# Patient Record
Sex: Male | Born: 1935 | Race: White | Hispanic: No | Marital: Married | Smoking: Former smoker
Health system: Southern US, Community
[De-identification: ages and names within clinical notes are randomized; demographics above are authoritative.]

## PROBLEM LIST (undated history)

## (undated) DIAGNOSIS — C679 Malignant neoplasm of bladder, unspecified: Secondary | ICD-10-CM

## (undated) DIAGNOSIS — Q453 Other congenital malformations of pancreas and pancreatic duct: Secondary | ICD-10-CM

## (undated) DIAGNOSIS — I10 Essential (primary) hypertension: Secondary | ICD-10-CM

## (undated) DIAGNOSIS — E039 Hypothyroidism, unspecified: Secondary | ICD-10-CM

## (undated) DIAGNOSIS — K5190 Ulcerative colitis, unspecified, without complications: Secondary | ICD-10-CM

## (undated) DIAGNOSIS — K859 Acute pancreatitis without necrosis or infection, unspecified: Secondary | ICD-10-CM

## (undated) DIAGNOSIS — I4891 Unspecified atrial fibrillation: Secondary | ICD-10-CM

## (undated) DIAGNOSIS — N183 Chronic kidney disease, stage 3 unspecified: Secondary | ICD-10-CM

## (undated) DIAGNOSIS — M1990 Unspecified osteoarthritis, unspecified site: Secondary | ICD-10-CM

## (undated) HISTORY — PX: CARDIAC CATHETERIZATION: SHX172

## (undated) HISTORY — DX: Chronic kidney disease, stage 3 unspecified: N18.30

## (undated) HISTORY — PX: COLONOSCOPY: SHX174

## (undated) HISTORY — PX: REPLACEMENT TOTAL KNEE: SUR1224

## (undated) HISTORY — DX: Malignant neoplasm of bladder, unspecified: C67.9

## (undated) HISTORY — DX: Acute pancreatitis without necrosis or infection, unspecified: K85.90

## (undated) HISTORY — DX: Chronic kidney disease, stage 3 (moderate): N18.3

## (undated) HISTORY — DX: Unspecified osteoarthritis, unspecified site: M19.90

## (undated) HISTORY — DX: Unspecified atrial fibrillation: I48.91

## (undated) HISTORY — PX: US ECHOCARDIOGRAPHY: HXRAD669

## (undated) HISTORY — PX: ESOPHAGOGASTRODUODENOSCOPY: SHX1529

## (undated) HISTORY — DX: Ulcerative colitis, unspecified, without complications: K51.90

## (undated) HISTORY — DX: Other congenital malformations of pancreas and pancreatic duct: Q45.3

## (undated) HISTORY — PX: CARDIOVASCULAR STRESS TEST: SHX262

---

## 2002-11-10 DIAGNOSIS — C679 Malignant neoplasm of bladder, unspecified: Secondary | ICD-10-CM

## 2002-11-10 HISTORY — DX: Malignant neoplasm of bladder, unspecified: C67.9

## 2003-05-11 HISTORY — PX: BACK SURGERY: SHX140

## 2009-02-08 HISTORY — PX: BLADDER SURGERY: SHX569

## 2011-05-11 HISTORY — PX: COLONOSCOPY: SHX174

## 2011-09-11 HISTORY — PX: EUS: SHX5427

## 2011-11-11 HISTORY — PX: FLEXIBLE SIGMOIDOSCOPY: SHX1649

## 2012-02-09 HISTORY — PX: FLEXIBLE SIGMOIDOSCOPY: SHX1649

## 2014-06-10 HISTORY — PX: BACK SURGERY: SHX140

## 2014-10-25 ENCOUNTER — Ambulatory Visit (INDEPENDENT_AMBULATORY_CARE_PROVIDER_SITE_OTHER): Payer: Medicare Other | Admitting: Gastroenterology

## 2014-10-25 ENCOUNTER — Encounter: Payer: Self-pay | Admitting: Gastroenterology

## 2014-10-25 VITALS — BP 150/87 | HR 71 | Temp 98.4°F | Ht 72.0 in | Wt 215.6 lb

## 2014-10-25 DIAGNOSIS — K5190 Ulcerative colitis, unspecified, without complications: Secondary | ICD-10-CM | POA: Insufficient documentation

## 2014-10-25 DIAGNOSIS — K861 Other chronic pancreatitis: Secondary | ICD-10-CM

## 2014-10-25 MED ORDER — PREDNISONE 1 MG PO TABS: ORAL_TABLET | ORAL | Status: DC

## 2014-10-25 NOTE — Progress Notes (Signed)
Primary Care Physician:  Andres Shad, MD  Primary Gastroenterologist:  Barney Drain, MD   Chief Complaint  Patient presents with  . Pancreatitis    HPI:  Louis Bowers is a 79 y.o. male here to establish care. He has h/o Ulcerative colitis diagnosed in 11/2011 and possible chronic pancreatitis, chronically abnormal alkphos, ALT.  Doing well. BM 1-2 solid/soft. No melena, brbpr. Prednisone for 3-4 years, on 23m daily chornically. No abdominal pain for one year. One episode of pancreatitis since EUS. Patient not real clear on details of his work up. Records available as outlined below. Last colonoscopy 2012. Heartburn well-controlled.   Current Outpatient Prescriptions  Medication Sig Dispense Refill  . CREON 24000 UNITS CPEP Take 72,000 Units by mouth 3 (three) times daily.     . DELZICOL 400 MG CPDR DR capsule Take 1,200 mg by mouth 3 (three) times daily.     . diphenhydrAMINE (BENADRYL) 25 mg capsule Take 25 mg by mouth every 6 (six) hours as needed.    .Marland KitchenELIQUIS 5 MG TABS tablet Take 5 mg by mouth 2 (two) times daily.     . hydrochlorothiazide (HYDRODIURIL) 25 MG tablet Take 25 mg by mouth daily.     .Marland Kitchenlevothyroxine (SYNTHROID, LEVOTHROID) 50 MCG tablet Take 50 mcg by mouth daily before breakfast.     . metoprolol (LOPRESSOR) 50 MG tablet Take 50 mg by mouth 3 (three) times daily.     .Marland KitchenPACERONE 100 MG tablet Take 100 mg by mouth daily.     . pantoprazole (PROTONIX) 40 MG tablet Take 40 mg by mouth daily.     . predniSONE (DELTASONE) 5 MG tablet Take 5 mg by mouth daily with breakfast.     . valsartan (DIOVAN) 320 MG tablet Take 320 mg by mouth daily.      No current facility-administered medications for this visit.    Allergies as of 10/25/2014  . (No Known Allergies)    Past Medical History  Diagnosis Date  . A-fib     Dr. ZRosalita Chessman . Pancreatitis     Dr.  SOliva Bustard Shifflett  . UC (ulcerative colitis)     Dr. SOliva Bustard Shifflett: diagnosed 11/2011 involved  entire colon  . Osteoarthritis   . Bladder cancer 2004    instilled liquid    Past Surgical History  Procedure Laterality Date  . [place]   Dr. DNathaneil CanaryShiflett: 5 polyps removed from cecal area all were benign  . Esophagogastroduodenoscopy  2005 [place]    Dr. DNathaneil CanaryShiflett: normal except for antral gastritis  . Colonoscopy  05/2011    Dr. DNathaneil CanaryShiflett:no polyps, diverticulosis  . Back surgery  05/2003  . Bladder surgery  02/2009  . Back surgery  06/2014  . Flexible sigmoidoscopy  02/2012    Dr. DNathaneil CanaryShiflett: advance up to 60cm, entire left colon with intense UC flare, no bx as patient was on coumdin  . Flexible sigmoidoscopy  11/2011    Dr. DNathaneil CanaryShiflett: diffuse colitis universal, cecal polyp removed (adenoma)  . Eus  09/2011    [facility]: chronic pancreatitis vs chronic smodering pancreatitis    Family History  Problem Relation Age of Onset  . Colon cancer Neg Hx   . Ulcerative colitis Neg Hx   . Liver disease Other     uncles: etoh    History   Social History  . Marital Status: Married    Spouse Name: N/A    Number of Children: 2  .  Years of Education: N/A   Occupational History  . 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 Smoking status: Never Smoker   . Smokeless tobacco: Not on file  . Alcohol Use: No  . Drug Use: No  . Sexual Activity: Not on file   Other Topics Concern  . Not on file   Social History Narrative      ROS:  General: Negative for anorexia, weight loss, fever, chills, fatigue, weakness. Eyes: Negative for vision changes.  ENT: Negative for hoarseness, difficulty swallowing , nasal congestion. CV: Negative for chest pain, angina, palpitations, dyspnea on exertion, peripheral edema.  Respiratory: Negative for dyspnea at rest, dyspnea on exertion, cough, sputum, wheezing.  GI: See history of present illness. GU:  Negative for dysuria, hematuria, urinary incontinence, urinary frequency,  nocturnal urination.  MS: Negative for joint pain, low back pain.  Derm: Negative for rash or itching.  Neuro: Negative for weakness, abnormal sensation, seizure, frequent headaches, memory loss, confusion.  Psych: Negative for anxiety, depression, suicidal ideation, hallucinations.  Endo: Negative for unusual weight change.  Heme: Negative for bruising or bleeding. Allergy: Negative for rash or hives.    Physical Examination:  BP 150/87 mmHg  Pulse 71  Temp(Src) 98.4 F (36.9 C) (Oral)  Ht 6' (1.829 m)  Wt 215 lb 9.6 oz (97.796 kg)  BMI 29.23 kg/m2   General: Well-nourished, well-developed in no acute distress.  Head: Normocephalic, atraumatic.   Eyes: Conjunctiva pink, no icterus. Mouth: Oropharyngeal mucosa moist and pink , no lesions erythema or exudate. Neck: Supple without thyromegaly, masses, or lymphadenopathy.  Lungs: Clear to auscultation bilaterally.  Heart: Regular rate and rhythm, no murmurs rubs or gallops.  Abdomen: Bowel sounds are normal, nontender, nondistended, no hepatosplenomegaly or masses, no abdominal bruits or    hernia , no rebound or guarding.   Rectal: not performed Extremities: No lower extremity edema. No clubbing or deformities.  Neuro: Alert and oriented x 4 , grossly normal neurologically.  Skin: Warm and dry, no rash or jaundice.   Psych: Alert and cooperative, normal mood and affect.  Labs: 02/2012 Tbili 0.6, AP 146H, AST 22, ALT 42H, alb 2.6L, Creatinine 1.24H, Hgb 13.5.  Imaging Studies: No results found.

## 2014-10-25 NOTE — Patient Instructions (Signed)
1. We will begin prednisone taper. Start at 51m daily for 2 weeks, then 367mdaily for 2 weeks, then 46m104maily for 2 weeks, then 1mg50mily for 2 weeks, then off. Call if you notice abdominal pain, increased number of stools, looser stools, blood in stools. 2. Stop prednisone 5mg 10mlets. 3. I will review your most recent labs. 4. Please check your records to see if you have had a bone density test to look for osteopenia or osteoporosis.  5. Return to the office in 3 months to meet Dr. FieldOneida Alarill discuss your case with her in the interim and if she decides you need any additional work up at this time we will let you know.

## 2014-10-28 ENCOUNTER — Encounter: Payer: Self-pay | Admitting: Gastroenterology

## 2014-10-28 NOTE — Assessment & Plan Note (Signed)
79 y/o male with h/o UC diagnosed in 2013. He reports being on chronic prednisone therapy. Doing well for one year. Last complete TCS 2012. H/o adenomatous polyps. Discussed attempting slow prednisone taper. Notably he is on high dose Delzicol and has elevated creatinine in 2013. Requested most recent labs. Will need to check creatinine and LFTs if not done. Return to office in 3 months to see Dr. Oneida Alar. Call with problems in the interim. He will also check his records to see if previous bone density done and if not we will get baseline.

## 2014-10-28 NOTE — Assessment & Plan Note (Signed)
H/o chronic pancreatitis. Previous EUS inconclusive for chronic pancreatitis vs smoldering pancreatitis. F/u MRCP was suggested but I don't have any records of this. Currently on pancreatic enzymes. To discuss with Dr. Oneida Alar.

## 2014-11-21 ENCOUNTER — Encounter: Payer: Self-pay | Admitting: Gastroenterology

## 2014-11-21 NOTE — Progress Notes (Signed)
cc'ed to pcp °

## 2014-12-06 NOTE — Progress Notes (Signed)
Reviewed labs from outside source May 2015 BUN 28, creatinine 1.39(upper limit of normal 1.35), hemoglobin 11.2 low, platelets 207,000, white blood cell count 8700, TSH 64.176, T3 76.49 low, T4 1 0.8 low Labs from January 2015, total bilirubin 0.3, alkaline phosphatase 74, AST 14, ALT 12, albumin 4.2. Hemoglobin A1c 6.4  Please let patient know that we received previous labs and reviewed.   He is due labs at this time including CMET, CBC, iron/tibc, ferritin, TSH.  How has he done with prednisone taper? Off prednisone yet?

## 2014-12-07 NOTE — Progress Notes (Addendum)
REVIEWED. Agree with slow steroid taper(decerease by 1 mg every 2 weeks) and updating labs. REVIEWED WITH BENNY. CAUTION IN RENAL INSUFFICIENCY BUT NO DEFINITE CUT OFF. FAVOR LOWEST THERAPEUTIC DOSE. GAINED 28 LBS SINCE 2013. CONTINUE PANCREATIC ENZYMES. CONTINUE TO MONITOR SYMPTOMS. REPEAT MRCP IF FOLLOW UP IMAGING RECOMMENDED BY OUTSIDE PROVIDER AND STUDY NOT COMPLETE.

## 2014-12-08 ENCOUNTER — Other Ambulatory Visit: Payer: Self-pay

## 2014-12-08 DIAGNOSIS — K861 Other chronic pancreatitis: Secondary | ICD-10-CM

## 2014-12-08 DIAGNOSIS — K5190 Ulcerative colitis, unspecified, without complications: Secondary | ICD-10-CM

## 2014-12-08 NOTE — Progress Notes (Signed)
Pt is aware of results. Lab orders faxed to [facility] and mailed to him per his request. He is aware he is due an [place] in March and will be contacted for that or he will call if he doesn't hear from office.

## 2014-12-12 ENCOUNTER — Telehealth: Payer: Self-pay

## 2014-12-12 NOTE — Telephone Encounter (Signed)
Manuela Schwartz from [facility] called and needed diagnosis for TSH. Nothing listed in chart to cover so she did not draw that since it would cost the pt $206.00.

## 2014-12-12 NOTE — Telephone Encounter (Signed)
Everardo All called Manuela Schwartz at [facility] [phone] and spoke to [place]. The TSH has been added with the ICD 10 code of E03.9 for Hypothyroidism.

## 2014-12-12 NOTE — Telephone Encounter (Signed)
TSH elevated at 64 on 03/2014. I wanted to recheck it.

## 2014-12-12 NOTE — Telephone Encounter (Signed)
Thanks

## 2014-12-26 ENCOUNTER — Encounter: Payer: Self-pay | Admitting: Gastroenterology

## 2014-12-26 NOTE — Progress Notes (Signed)
Patient ID: Louis Bowers, male   DOB: 1934-12-16, 80 y.o.   MRN: [id number]  Labs dated 12/12/2014 reviewed White blood cell count 7500, hemoglobin 10.9, hematocrit 34.6, MCV 89.4, platelets 204,000 on BUN 33, creatinine 1.4, total bilirubin 0.3, alkaline phosphatase 82, AST 15, ALT 15, albumin 3.7 Iron 31 low, TIBC 498 high, iron saturation 6% low TSH 6.61 high   1. Find out what dose prednisone he is on currently. How is his UC? 2. He needs to follow up with provider who is managing his thyroid, his TSH was 6.61 this month and may need to have synthroid adjusted. 3. We will need to work towards getting his Delzicol dose down due to renal insufficiency. If he tolerates prednisone taper we may try to drop Delzicol to 850m TID at his next appointment. 4. At his next appointment, we need to also discuss possibility of MRI abd/MRCP to follow up on pancreatic abnormalities seen on EUS in 2012 per Dr. FOneida Alar   Is [facility] schedule out for march, if so please get patient scheduled (reason for visit: UC, pancreatic abnormality)

## 2014-12-27 NOTE — Progress Notes (Signed)
I called pt. He said he is doing very well now. He is taking Delzicol 400 mg, three tablets tid.  He is taking Prednisone 5 mg daily now.  He will discuss his TSH with PCP.  He is aware he will be contacted about an appt in the very near future.

## 2014-12-27 NOTE — Progress Notes (Signed)
APPOINTMENT MADE AND PATIENT AWARE OF DATE AND TIME

## 2015-01-24 ENCOUNTER — Encounter: Payer: Self-pay | Admitting: Gastroenterology

## 2015-01-24 ENCOUNTER — Ambulatory Visit (INDEPENDENT_AMBULATORY_CARE_PROVIDER_SITE_OTHER): Payer: Medicare Other | Admitting: Gastroenterology

## 2015-01-24 VITALS — BP 138/76 | HR 57 | Temp 97.8°F | Ht 71.0 in | Wt 221.0 lb

## 2015-01-24 DIAGNOSIS — K5190 Ulcerative colitis, unspecified, without complications: Secondary | ICD-10-CM | POA: Diagnosis not present

## 2015-01-24 DIAGNOSIS — K861 Other chronic pancreatitis: Secondary | ICD-10-CM | POA: Diagnosis not present

## 2015-01-24 NOTE — Patient Instructions (Signed)
[place].   TRY TO TAPER PREDNISONE TO 3 MG DAILY.  TAPER YOUR PREDNISONE TO 2 MG BEFORE YOUR VISIT WITH DR. HALL SO HE CAN SEE THE RASH AND DETERMINE WHETHER YOU NEED THE PREDNISONE CHRONICALLY. STOP THE ELIQUIS 2 DAYS PRIOR TO YOUR VISIT WITH DR. HALL, HE MAY CONSIDER A SKIN BIOPSY.  FOLLOW UP IN 4 MOS.

## 2015-01-24 NOTE — Assessment & Plan Note (Signed)
SYMPTOMS CONTROLLED/RESOLVED. GOT A RHHS WHEN PREDNISONE TAPERED.  CONTINUE DELZICOL. IT MAY CAUSE A RASH. TRY TO TAPER PREDNISONE TO 3 MG DAILY. SEE DR. HALL FOR SKIN EVAL. HE MAY CONSIDER A SKIN BIOPSY. FOLLOW UP IN 4 MOS.

## 2015-01-24 NOTE — Progress Notes (Signed)
ON RECALL LIST  °

## 2015-01-24 NOTE — Assessment & Plan Note (Signed)
SYMPTOMS CONTROLLED/RESOLVED.  CONTINUE CREON. WATCH FAT INTAKE FOLLOW UP IN 4 MOS.

## 2015-01-24 NOTE — Progress Notes (Signed)
Subjective:    Patient ID: Louis Bowers, male    DOB: [birth year], 80 y.o.   MRN: [id number]  Louis Shad, MD  HPI No questions or concerns. HAD BLOOD DRAWN FEB 2016. BMs: 1-2 DAILY. EATS LOTS OF FIBER(APPLES, CABBAGE). HAD BACK OPERATION IN AUG 2015 AND KNEE REPLACEMENT DEC 2015. WEIGHT GAIN. APPETITE: GOOD. STAMINA COULD BE BETTER. BLOOD THINNER: ELIQUIS FOR 1-2 YRS. TRIED TO WEAN OFF STEROIDS(2 TO 3 MG) AND RASH ON ARMS, STOMACH, BODY, AND BACK. ON 1 MG UNCOMFORTABLE(ITCHING-DR. HALL SKIN MD-NEXT OPV APR 2016).   PT DENIES FEVER, CHILLS, HEMATOCHEZIA, nausea, vomiting, melena, diarrhea, CHEST PAIN, SHORTNESS OF BREATH,  constipation, abdominal pain, problems swallowing, problems with sedation, OR heartburn or indigestion.  Past Medical History  Diagnosis Date  . A-fib     Dr. Rosalita Chessman  . Pancreatitis     Dr.  Oliva Bustard. Shifflett  . UC (ulcerative colitis)     Dr. Oliva Bustard. Shifflett: diagnosed 11/2011 involved entire colon  . Osteoarthritis   . Bladder cancer 2004    instilled chemo   Past Surgical History  Procedure Laterality Date  . Colonoscopy N/A 2005 [place]    Dr. Nathaneil Canary Shiflett: 5 polyps removed from cecal area all were benign  . Esophagogastroduodenoscopy  2005 [place]    Dr. Nathaneil Canary Shiflett: normal except for antral gastritis  . Colonoscopy  05/2011    Dr. Nathaneil Canary Shiflett:no polyps, diverticulosis  . Back surgery  05/2003  . Bladder surgery  02/2009  . Back surgery  06/2014  . Flexible sigmoidoscopy  02/2012    Dr. Nathaneil Canary Shiflett: advance up to 60cm, entire left colon with intense UC flare, no bx as patient was on coumdin  . Flexible sigmoidoscopy  11/2011    Dr. Nathaneil Canary Shiflett: diffuse colitis universal, cecal polyp removed (adenoma)  . Eus  09/2011    [facility]: chronic pancreatitis vs chronic smodering pancreatitis   No Known Allergies   Current Outpatient Prescriptions  Medication Sig Dispense Refill  . CREON 24000 UNITS CPEP Take 72,000  Units by mouth 3 (three) times daily.     . DELZICOL 400 MG CPDR DR capsule Take 1,200 mg by mouth 3 (three) times daily.  SINCE 2013   . ELIQUIS 5 MG TABS tablet Take 5 mg by mouth 2 (two) times daily.     . hydrochlorothiazide (HYDRODIURIL) 25 MG tablet Take 25 mg by mouth daily.     Marland Kitchen levothyroxine (SYNTHROID, LEVOTHROID) 50 MCG tablet Take 50 mcg by mouth daily before breakfast.     . metoprolol (LOPRESSOR) 50 MG tablet Take 50 mg by mouth 3 (three) times daily.     Marland Kitchen PACERONE 100 MG tablet Take 100 mg by mouth daily.     . pantoprazole (PROTONIX) 40 MG tablet Take 40 mg by mouth daily.     . valsartan (DIOVAN) 320 MG tablet Take 320 mg by mouth daily.     . diphenhydrAMINE (BENADRYL) 25 mg capsule Take 25 mg by mouth every 6 (six) hours as needed. FOR SLEEP   . PREDNISONE 5 MG DAILY FOR RASH    Family History  Problem Relation Age of Onset  . Colon cancer Neg Hx   . Ulcerative colitis Neg Hx   . Liver disease Other     uncles: etoh   History   Social History  . Marital Status: Married    Spouse Name: N/A  . Number of Children: 2  . Years of Education: N/A   Occupational History  .  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 Smoking status: Never Smoker   . Smokeless tobacco: Not on file  . Alcohol Use: No  . Drug Use: No  . Sexual Activity: Not on file   Other Topics Concern  . Not on file   Social History Narrative    Review of Systems PER HPI OTHERWISE ALL SYSTEMS ARE NEGATIVE.    Objective:   Physical Exam  Constitutional: He is oriented to person, place, and time. He appears well-developed and well-nourished. No distress.  HENT:  Head: Normocephalic and atraumatic.  Mouth/Throat: Oropharynx is clear and moist. No oropharyngeal exudate.  Eyes: Pupils are equal, round, and reactive to light. No scleral icterus.  Neck: Normal range of motion. Neck supple.  Cardiovascular: Normal rate, regular rhythm and normal heart sounds.     Pulmonary/Chest: Effort normal and breath sounds normal. No respiratory distress.  Abdominal: Soft. Bowel sounds are normal. He exhibits no distension. There is no tenderness.  Musculoskeletal: He exhibits edema (TRACE BIL LE).  Lymphadenopathy:    He has no cervical adenopathy.  Neurological: He is alert and oriented to person, place, and time.  NO FOCAL DEFICITS   Psychiatric: He has a normal mood and affect.  Vitals reviewed.         Assessment & Plan:

## 2015-01-30 NOTE — Progress Notes (Signed)
CC'ED TO PCP 

## 2015-02-12 ENCOUNTER — Other Ambulatory Visit: Payer: Self-pay

## 2015-02-14 NOTE — Telephone Encounter (Signed)
Please find out what dose prednisone he is on. Last note said was tapering to 58m daily. Need to know so I can give 118mtablets instead of 31m60mf needed.

## 2015-02-15 NOTE — Telephone Encounter (Signed)
I called Louis Bowers. He said that Dr. Oneida Alar wanted him to decrease to 2 mg. He goes back to see Dr. [place] Crane on 03/19/2015.  He would like to get 200 tablets. He said if the 2 mg works he will probably stay on that.

## 2015-02-16 MED ORDER — PREDNISONE 1 MG PO TABS: 2.0000 mg | ORAL_TABLET | Freq: Every day | ORAL | Status: DC

## 2015-02-16 NOTE — Telephone Encounter (Signed)
Done

## 2015-03-06 ENCOUNTER — Other Ambulatory Visit: Payer: Self-pay

## 2015-03-07 MED ORDER — MESALAMINE 400 MG PO CPDR: 1200.0000 mg | DELAYED_RELEASE_CAPSULE | Freq: Three times a day (TID) | ORAL | Status: DC

## 2015-03-14 ENCOUNTER — Other Ambulatory Visit: Payer: Self-pay

## 2015-03-16 MED ORDER — PANCRELIPASE (LIP-PROT-AMYL) 24000-76000 UNITS PO CPEP: 72000.0000 [IU] | ORAL_CAPSULE | Freq: Three times a day (TID) | ORAL | Status: DC

## 2015-03-19 ENCOUNTER — Telehealth: Payer: Self-pay

## 2015-03-19 NOTE — Telephone Encounter (Signed)
Pt came by the office to see about prescription refills. He asked about creon. Informed him that it was sent to the pharmacy on Friday 03/16/15.   He also asked if we could change rx for delzicol to #90 day supply instead of #30 day supply.   Routing to [place] and the refill box.

## 2015-03-20 NOTE — Telephone Encounter (Signed)
Can we please verify how he is taking Delzicol?

## 2015-03-20 NOTE — Telephone Encounter (Signed)
Pt called and states that the Delzicol he takes is 3 pills TID.  Also states the Creon is  the same dosage (3 pills TID)

## 2015-03-20 NOTE — Telephone Encounter (Signed)
LMOM for a return call.  

## 2015-03-22 ENCOUNTER — Other Ambulatory Visit: Payer: Self-pay

## 2015-03-23 MED ORDER — MESALAMINE 400 MG PO CPDR
1200.0000 mg | DELAYED_RELEASE_CAPSULE | Freq: Three times a day (TID) | ORAL | Status: DC
Start: 2015-03-23 — End: 2015-07-19

## 2015-03-23 NOTE — Telephone Encounter (Signed)
Spoke with wife Tessie Fass) states she will tell pt.

## 2015-03-23 NOTE — Telephone Encounter (Signed)
Please let patient know that I prescribed only one 90 day supply because we may be changing his dose once he is able to taper off his prednisone.   Purpose of trying to taper down his dose is due to his mildly elevated creatinine.   I sent RX to [facility] for 90 day. I hope that was correct because I didn't see any other pharmacies listed.

## 2015-04-25 ENCOUNTER — Encounter: Payer: Self-pay | Admitting: Gastroenterology

## 2015-05-11 ENCOUNTER — Other Ambulatory Visit: Payer: Self-pay

## 2015-05-15 MED ORDER — PREDNISONE 1 MG PO TABS: 2.0000 mg | ORAL_TABLET | Freq: Every day | ORAL | Status: DC

## 2015-05-28 ENCOUNTER — Encounter: Payer: Self-pay | Admitting: Gastroenterology

## 2015-05-28 ENCOUNTER — Ambulatory Visit (INDEPENDENT_AMBULATORY_CARE_PROVIDER_SITE_OTHER): Payer: Medicare Other | Admitting: Gastroenterology

## 2015-05-28 VITALS — BP 119/72 | HR 64 | Temp 97.9°F | Ht 72.0 in | Wt 219.4 lb

## 2015-05-28 DIAGNOSIS — K861 Other chronic pancreatitis: Secondary | ICD-10-CM | POA: Diagnosis not present

## 2015-05-28 DIAGNOSIS — K5190 Ulcerative colitis, unspecified, without complications: Secondary | ICD-10-CM | POA: Diagnosis not present

## 2015-05-28 NOTE — Progress Notes (Signed)
Primary Care Physician: Andres Shad, MD  Primary Gastroenterologist:  Barney Drain, MD   Chief Complaint  Patient presents with  . Follow-up    HPI: Louis Bowers is a 80 y.o. male here for follow-up of ulcerative colitis and chronic pancreatitis. Last seen in March 2016 by Dr. Oneida Alar. Overall patient feels well. Little bit of constipation since starting bladder medications. Denies any blood in the stool. No abdominal pain. 24 hour episode of nausea, vomiting, diarrhea couple weeks ago. Appetite is good. Currently on 2 mg of prednisone daily. Has not seen any rash on his arms or legs like before. Continues Delzicol 3 tablets 3 times daily.  Next TCS due July 2017  Wt Readings from Last 3 Encounters:  05/28/15 219 lb 6.4 oz (99.519 kg)  01/24/15 221 lb (100.245 kg)  10/25/14 215 lb 9.6 oz (97.796 kg)     Current Outpatient Prescriptions  Medication Sig Dispense Refill  . diphenhydrAMINE (BENADRYL) 25 mg capsule Take 25 mg by mouth every 6 (six) hours as needed.    Marland Kitchen ELIQUIS 5 MG TABS tablet Take 5 mg by mouth 2 (two) times daily.     . hydrochlorothiazide (HYDRODIURIL) 25 MG tablet Take 25 mg by mouth daily.     Marland Kitchen levothyroxine (SYNTHROID, LEVOTHROID) 50 MCG tablet Take 50 mcg by mouth daily before breakfast.     . Mesalamine (DELZICOL) 400 MG CPDR DR capsule Take 3 capsules (1,200 mg total) by mouth 3 (three) times daily. 810 capsule 0  . metoprolol (LOPRESSOR) 50 MG tablet Take 50 mg by mouth 3 (three) times daily.     Marland Kitchen PACERONE 100 MG tablet Take 100 mg by mouth daily.     . Pancrelipase, Lip-Prot-Amyl, (CREON) 24000 UNITS CPEP Take 3 capsules (72,000 Units total) by mouth 3 (three) times daily. 270 capsule 5  . pantoprazole (PROTONIX) 40 MG tablet Take 40 mg by mouth daily.     . predniSONE (DELTASONE) 1 MG tablet Take 2 tablets (2 mg total) by mouth daily with breakfast. 200 tablet 0  . solifenacin (VESICARE) 10 MG tablet Take by mouth daily.    . valsartan  (DIOVAN) 320 MG tablet Take 320 mg by mouth daily.      No current facility-administered medications for this visit.    Allergies as of 05/28/2015  . (No Known Allergies)    ROS:  General: Negative for anorexia, weight loss, fever, chills, fatigue, weakness. ENT: Negative for hoarseness, difficulty swallowing , nasal congestion. CV: Negative for chest pain, angina, palpitations, dyspnea on exertion, peripheral edema.  Respiratory: Negative for dyspnea at rest, dyspnea on exertion, cough, sputum, wheezing.  GI: See history of present illness. GU:  Negative for dysuria, hematuria, urinary incontinence, urinary frequency, nocturnal urination.  Endo: Negative for unusual weight change.    Physical Examination:   BP 119/72 mmHg  Pulse 64  Temp(Src) 97.9 F (36.6 C)  Ht 6' (1.829 m)  Wt 219 lb 6.4 oz (99.519 kg)  BMI 29.75 kg/m2  General: Well-nourished, well-developed in no acute distress.  Eyes: No icterus. Mouth: Oropharyngeal mucosa moist and pink , no lesions erythema or exudate. Lungs: Clear to auscultation bilaterally.  Heart: Regular rate and rhythm, no murmurs rubs or gallops.  Abdomen: Bowel sounds are normal, nontender, nondistended, no hepatosplenomegaly or masses, no abdominal bruits or hernia , no rebound or guarding.   Extremities: No lower extremity edema. No clubbing or deformities. Neuro: Alert and oriented x 4   Skin: Warm  and dry, no jaundice.   Psych: Alert and cooperative, normal mood and affect.  Imaging Studies: No results found.  Last labs available from February 2016 White blood cell count 7500, hemoglobin 10.9, hematocrit 34.6, MCV 89.4, platelets 204,000 on BUN 33, creatinine 1.4, total bilirubin 0.3, alkaline phosphatase 82, AST 15, ALT 15, albumin 3.7 Iron 31 low, TIBC 498 high, iron saturation 6% low TSH 6.61 high

## 2015-05-28 NOTE — Assessment & Plan Note (Signed)
Symptoms controlled. Continue Creon. Return to the office in 4 months.

## 2015-05-28 NOTE — Assessment & Plan Note (Signed)
Continues to do well. Tapered to 2 mg of prednisone daily without recurrent rash. We'll try to continue taper with eventually stopping medication if tolerated. After he is off prednisone for couple weeks would like to see his Delzicol dose reduce to 2 tablets 3 times daily in light of his elevated creatinine.   Retrieve copy of latest labs.  Return to the office in 4 months or call sooner if needed.

## 2015-05-28 NOTE — Progress Notes (Signed)
cc'ed to pcp °

## 2015-05-28 NOTE — Patient Instructions (Signed)
1. I will request copy of labs for review. 2. Cut back on prednisone to 18m daily for 7 days. If you are doing well at that time then you can stop the prednisone.  3. After being off prednisone for 2 weeks, if you are doing well, then I want you to cut back on Delzicol to 2 pills three times daily.  4. Return to the office in four months for follow up.

## 2015-06-25 ENCOUNTER — Telehealth: Payer: Self-pay | Admitting: Gastroenterology

## 2015-06-25 MED ORDER — PREDNISONE 10 MG PO TABS: ORAL_TABLET | ORAL | Status: DC

## 2015-06-25 NOTE — Addendum Note (Signed)
Addended by: Danie Binder on: 06/25/2015 05:18 PM   Modules accepted: Orders

## 2015-06-25 NOTE — Telephone Encounter (Signed)
[phone]    PLEASE CALL, HE IS HAVING A FLAIR UP OF STOMACH PAIN.  PATIENT BELIEVES HE NEEDS TO BE ON PREDNISONE TO CONTROL IT.  HE HAS COME OFF OF IT AND [facility] TOLD HIM TO CALL OFFICE IF HE HAS A FLAIR UP

## 2015-06-25 NOTE — Telephone Encounter (Addendum)
BEEN OFF PREDNISONE FOR ABOUT A MONTH. NOT HAVING A NL BM. SML WATERY STOOL x1 TODAY. NAUSEATED FOR 2 WEEKS. NO BLOOD IN STOOL.  TAKING DELZICOL 3 PO TID. PRENISONE 20 MG QD FOR 2 WEEKS THEN 10 MG DAILY FOR 2 WEEKS. HAS NEVER TRIED IMURAN OR BIOLOGIC. ABX RECENTLY-FOR BLADDER(ONE DOSE LAST WEEK). PT ASKED TO CALL WITH QUESTIONS OR CONCERNS.

## 2015-06-25 NOTE — Telephone Encounter (Signed)
I called pt and he said he has been having more problems with his stomach for about 2 weeks and Dr. Oneida Alar told him to call if he feels he needs prednisone.

## 2015-07-19 ENCOUNTER — Encounter: Payer: Self-pay | Admitting: Gastroenterology

## 2015-07-19 ENCOUNTER — Telehealth: Payer: Self-pay | Admitting: Gastroenterology

## 2015-07-19 MED ORDER — PREDNISONE 5 MG PO TABS: ORAL_TABLET | ORAL | Status: DC

## 2015-07-19 MED ORDER — MESALAMINE 400 MG PO CPDR: 1200.0000 mg | DELAYED_RELEASE_CAPSULE | Freq: Three times a day (TID) | ORAL | Status: DC

## 2015-07-19 MED ORDER — PANCRELIPASE (LIP-PROT-AMYL) 24000-76000 UNITS PO CPEP: 72000.0000 [IU] | ORAL_CAPSULE | Freq: Three times a day (TID) | ORAL | Status: DC

## 2015-07-19 NOTE — Telephone Encounter (Signed)
Pt is aware. [facility] was correct. Routing to [place] to move appt up with Dr. Oneida Alar.

## 2015-07-19 NOTE — Telephone Encounter (Signed)
Patient called this morning with multiple questions about his prescriptions (Creon, Prednisone, Disacol) He is wanting a 90 day supply and is needing the ?disacol filled asap because he will run out next Tuesday or Wednesday. Please call him at either [phone] or [phone]

## 2015-07-19 NOTE — Telephone Encounter (Signed)
APPT MADE AND LETTER SENT  °

## 2015-07-19 NOTE — Telephone Encounter (Signed)
Prednisone 67m daily for one week, then 530mdaily for one week, then 2.4m32maily until OV with [facility]F.  Please move up appointment with [facility]F in the next 3-4 weeks if possible for UC/may need change of chronic meds.  Doris, I sent all RXs to [facility] cause that was the only pharmacy listed. I'm not sure if that was correct for the 90day supply meds.

## 2015-07-19 NOTE — Addendum Note (Signed)
Addended by: Mahala Menghini on: 07/19/2015 02:35 PM   Modules accepted: Orders, Medications

## 2015-07-19 NOTE — Telephone Encounter (Signed)
Pt is requesting a 90 day supply of the Creon and Delzicol . He also is requesting a refill on the prednisone. I see Neil Crouch, PA has him tapering off. Pt thinks he needs to stay on some, said he had the flare when he decreased to 2 mg. Please advise and send in appropriate refills.

## 2015-08-11 HISTORY — PX: BACK SURGERY: SHX140

## 2015-08-16 ENCOUNTER — Ambulatory Visit: Payer: Medicare Other | Admitting: Gastroenterology

## 2015-08-22 ENCOUNTER — Encounter: Payer: Self-pay | Admitting: Gastroenterology

## 2015-08-23 ENCOUNTER — Telehealth: Payer: Self-pay | Admitting: Gastroenterology

## 2015-08-23 NOTE — Telephone Encounter (Signed)
Pt returned call. He said he had back surgery on 08/15/2015. He has been on pain meds, taking stool softer and BM's doing very well. This Am he started having chills and nausea and feels it is from his UC and that he needs to go back on the Prednisone. He has Prednisone 5 mg #45 on hand but would like advice on how to take it.  He said previously before he came here he had been on a daily maintenance dose and he feels like that may be necessary eventually. Please advise!

## 2015-08-23 NOTE — Telephone Encounter (Signed)
LMOM to call for some more information.

## 2015-08-23 NOTE — Telephone Encounter (Signed)
Patient called to say that SF had put him on Prednisone 2.5 mg and he has had back surgery recently and is now having a flare up with chills and nausea. I told him that nurse wasn't available at the moment but I would have her return his call. He agreed. [phone]

## 2015-08-24 ENCOUNTER — Other Ambulatory Visit: Payer: Self-pay | Admitting: Gastroenterology

## 2015-08-24 ENCOUNTER — Encounter: Payer: Self-pay | Admitting: Gastroenterology

## 2015-08-24 ENCOUNTER — Other Ambulatory Visit: Payer: Self-pay

## 2015-08-24 DIAGNOSIS — R197 Diarrhea, unspecified: Secondary | ICD-10-CM

## 2015-08-24 NOTE — Telephone Encounter (Signed)
Called pt and LMOM for a return call.

## 2015-08-24 NOTE — Telephone Encounter (Signed)
I called Dr. Theodoro Grist office and got recorder, office hours Mon-Thur  8-5. I spoke to pt and he is aware he will need to pick up specimen cup at [facility] in [place] on [address]  [place] order was faxed to [phone].  Routing to [place] to make appt.

## 2015-08-24 NOTE — Telephone Encounter (Signed)
Called patient to discuss. NEEDS TO HAVE NEGATIVE C DIFF  BEFORE INCREASING PREDNISONE. IF HE TAKES PREDNISONE AND HAS  C DIFF IT WILL KILL HIM. FAX ORDER FOR C DIFF PCR TO [facility] [place]  PLAN: 1. CALL DR.WINFIELDS [facility] SO THEY CAN SUPPLY PT WITH STOOL SAMPLE CUP. 2. PT WILL PICK UP CUP AND DROP OFF STOOL SAMPLE TO [facility] IN [place] 3. IF CDIFF NEGATIVE, PT MAY START PREDNISONE 10 MG QD FOR 2 WEEKS THEN 5 MG DAILY FOR 2 WEEKS THEN DOWN TO 2.5 MG DAILY. 4. COMPLETED PT IN 6 WEEKS. PT REQUEST APPT AFTER THAT/ NEXT OPV IN 8 WEEKS E30 [facility]F ULCERATIVE COLITIS

## 2015-08-26 LAB — CLOSTRIDIUM DIFFICILE BY PCR: CDIFFPCR: NEGATIVE

## 2015-08-27 NOTE — Telephone Encounter (Signed)
Received cdiff results from [facility]. cdiff is negative.

## 2015-08-27 NOTE — Telephone Encounter (Signed)
PLEASE CALL PT. HIS C DIFF IS NEGATIVE. HE MAY START PREDNISONE 10 MG QD FOR 2 WEEKS THEN 5 MG DAILY FOR 2 WEEKS THEN DOWN TO 2.5 MG DAILY.

## 2015-08-28 NOTE — Telephone Encounter (Signed)
I called and informed pt. He is feeling much better. He is taking the 2.5 mg daily and feels like maybe he got overly anxious. Wants to know if Dr. Oneida Alar wants him to just stay on the 2.5 mg for now. He has 5 mg tablets and can cut in half for the time being and let us know when he needs 2.5 mg sent to the pharmacy. Please advise!

## 2015-08-29 NOTE — Telephone Encounter (Signed)
AFTER HE TALKED TO ME HE GOT TO FEELING BETTER. STILL ON PREDNISONE 2.5 MG DAILY. WILL CONTINUE UNTIL NEXT VISIT.

## 2015-09-17 ENCOUNTER — Telehealth: Payer: Self-pay

## 2015-09-17 NOTE — Telephone Encounter (Signed)
Pt is calling because his back doctor told him to take Al vee but his wanting to know SLF thinks he can take. Please advise

## 2015-09-20 NOTE — Telephone Encounter (Signed)
PLEASE CALL PT. HE SHOULD AVOID ALEVE AT DOSES HIGHER THAN 220 MG TWICE DAILY. HE MAY USE TYLENOL INSTEAD OF OR IN ADDITION TO ALEVE FOR PAIN RELIEF.

## 2015-09-20 NOTE — Telephone Encounter (Signed)
Pt is aware.  

## 2015-10-24 ENCOUNTER — Ambulatory Visit (INDEPENDENT_AMBULATORY_CARE_PROVIDER_SITE_OTHER): Payer: Medicare Other | Admitting: Gastroenterology

## 2015-10-24 ENCOUNTER — Encounter: Payer: Self-pay | Admitting: Gastroenterology

## 2015-10-24 VITALS — BP 133/84 | HR 67 | Temp 98.1°F | Ht 70.0 in | Wt 207.0 lb

## 2015-10-24 DIAGNOSIS — K5190 Ulcerative colitis, unspecified, without complications: Secondary | ICD-10-CM

## 2015-10-24 DIAGNOSIS — K861 Other chronic pancreatitis: Secondary | ICD-10-CM | POA: Diagnosis not present

## 2015-10-24 MED ORDER — PREDNISONE 2.5 MG PO TABS: 2.5000 mg | ORAL_TABLET | Freq: Every day | ORAL | Status: DC

## 2015-10-24 NOTE — Progress Notes (Signed)
Primary Care Physician: Andres Shad, MD  Primary Gastroenterologist:  Barney Drain, MD   Chief Complaint  Patient presents with  . Ulcerative Colitis    HPI: Louis Bowers is a 80 y.o. 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 Last seen in July 2016. He had back surgery back in October. Developed constipation on narcotics and then with laxatives developed diarrhea. Initially he thought it was flaring but symptoms settle down. He remains on prednisone 2.5 mg daily. Really has not been off of prednisone for a substantial period of time. Currently having formed bowel movement, no blood in the stool. No abdominal pain. Appetite is good. 12 pound weight loss since July, intentional.  Next colonoscopy due July 2017.    Current Outpatient Prescriptions  Medication Sig Dispense Refill  . acetaminophen (TYLENOL) 500 MG tablet Take by mouth.    . diphenhydrAMINE (BENADRYL) 25 mg capsule Take 25 mg by mouth every 6 (six) hours as needed.    Marland Kitchen ELIQUIS 5 MG TABS tablet Take 5 mg by mouth 2 (two) times daily.     . finasteride (PROSCAR) 5 MG tablet Take by mouth.    . Glucosamine Sulfate 500 MG CAPS Take by mouth.    . hydrochlorothiazide (HYDRODIURIL) 25 MG tablet Take 25 mg by mouth daily.     Marland Kitchen levothyroxine (SYNTHROID, LEVOTHROID) 50 MCG tablet Take 50 mcg by mouth daily before breakfast.     . Mesalamine (DELZICOL) 400 MG CPDR DR capsule Take 3 capsules (1,200 mg total) by mouth 3 (three) times daily. 810 capsule 1  . metoprolol (LOPRESSOR) 50 MG tablet Take 50 mg by mouth 3 (three) times daily.     Marland Kitchen PACERONE 100 MG tablet Take 100 mg by mouth daily.     . Pancrelipase, Lip-Prot-Amyl, (CREON) 24000 UNITS CPEP Take 3 capsules (72,000 Units total) by mouth 3 (three) times daily. 810 capsule 3  . pantoprazole (PROTONIX) 40 MG tablet Take 40 mg by mouth daily.     . predniSONE (DELTASONE) 2.5 MG tablet Take 2.5 mg by mouth daily with breakfast.      . solifenacin (VESICARE) 10 MG tablet Take by mouth daily.    . tamsulosin (FLOMAX) 0.4 MG CAPS capsule Take by mouth.    . valsartan (DIOVAN) 320 MG tablet Take 320 mg by mouth daily.      No current facility-administered medications for this visit.    Allergies as of 10/24/2015  . (No Known Allergies)    ROS:  General: Negative for anorexia, weight loss, fever, chills, fatigue, weakness. ENT: Negative for hoarseness, difficulty swallowing , nasal congestion. CV: Negative for chest pain, angina, palpitations, dyspnea on exertion, peripheral edema.  Respiratory: Negative for dyspnea at rest, dyspnea on exertion, cough, sputum, wheezing.  GI: See history of present illness. GU:  Negative for dysuria, hematuria, urinary incontinence, urinary frequency, nocturnal urination.  Endo: Negative for unusual weight change.    Physical Examination:   BP 133/84 mmHg  Pulse 67  Temp(Src) 98.1 F (36.7 C) (Oral)  Ht 5' 10"  (1.778 m)  Wt 207 lb (93.895 kg)  BMI 29.70 kg/m2  General: Well-nourished, well-developed in no acute distress.  Eyes: No icterus. Mouth: Oropharyngeal mucosa moist and pink , no lesions erythema or exudate. Lungs: Clear to auscultation bilaterally.  Heart: Regular rate and rhythm, no murmurs rubs or gallops.  Abdomen: Bowel sounds are normal, nontender, nondistended, no hepatosplenomegaly or masses, no abdominal bruits or hernia , no  rebound or guarding.   Extremities: No lower extremity edema. No clubbing or deformities. Neuro: Alert and oriented x 4   Skin: Warm and dry, no jaundice.   Psych: Alert and cooperative, normal mood and affect.  Labs:  We have requested outside labs.  Imaging Studies: No results found.

## 2015-10-24 NOTE — Progress Notes (Signed)
cc'ed to pcp °

## 2015-10-24 NOTE — Patient Instructions (Addendum)
1. Continue prednisone 2.76m daily for now. I sent in new RX for 2.534mtablets to your pharmacy.  2. We will request records for previous MRI and [place] density [place] [place] look [place] and if you find any MRIs or CTs of your abdomen or bone density study since 2012 please fax to [phone]. You are due blood work at this time to follow up on pancreas, liver, colitis.  4. Return to the office in four months or call sooner if needed.

## 2015-10-24 NOTE — Assessment & Plan Note (Signed)
Doing well at this time on 2.5 mg of prednisone daily along with Delzicol 3 tablets 3 times daily. I had encouraged him to drop back to 2 tablets 3 times daily in light of his history of elevated creatinine at time of his last office visit but for some reason he has not done this. We have requested records regarding possible prior bone density study. He will look through his files at home as well. We have requested recent labs from multiple providers per patient request. If appropriate labs have not been obtained we will have him do those in the near future. Specifically needing CBC, CMET. Return to office in four months to see Dr. Oneida Alar.

## 2015-10-24 NOTE — Assessment & Plan Note (Signed)
Clinically doing well. Previously on endoscopic ultrasound, provider recommended follow-up MRI/MRCP in 3-6 months. Patient is unsure whether he had this done and we have not been able to obtain any records indicating that it has. Per patient request, he will look through his files at home, we will request stated imaging from [facility] or he said it would've been done. If we are not able to determine that his MRI was definitely done, we will plan on ordering in the near future.

## 2015-11-19 ENCOUNTER — Encounter: Payer: Self-pay | Admitting: Gastroenterology

## 2015-11-19 NOTE — Progress Notes (Addendum)
Reviewed multiple records from outside sources.  CT abdomen pelvis without contrast dated 12/02/2012 at [facility] diagnostic imaging Multiple hepatic cyst again noted compared to 2012. Cyst within the upper pole the left kidney increased mildly in size, however calcifications associated for stable. Previous renal ultrasound showed cyst appears simple in nature. Right renal cyst stable. Follow-up with renal ultrasound recommended to ensure stability of left renal cyst.  No evidence of prior MRCP or bone density study.  Most recent labs on file dated 08/14/2015 from [facility] blood cell count 6300, hemoglobin 11 low, hematocrit 35.3 low, platelets 211,000, sodium 138, potassium 3.8, BUN 25, creatinine 1.3, AST 22, ALT 14, total bilirubin 0.5, alkaline phosphatase 53, albumin 4, estimated GFR 53  08/17/2015 post-op back surgery at [facility], hemoglobin 8.6, hematocrit 28.4, platelets 164,000, white blood cell count 8300.  Please let patient know that I reviewed records from multiple sources.  #1 Needs MRI abd/MRCP DX: idiopathic pancreatitis, renal lesions, left renal cyst enlarging from 2012 to 2014 (NOTIFY MRI OF PATIENT'S RENAL INSUFFICIENCY0 #2 Bone density study baseline given UC, nonurgent #3 labs as ordered because need of current labs for MRI and update Hgb. #4 return OV with [facility]F only 02/2016

## 2015-11-21 NOTE — Progress Notes (Signed)
I called and informed pt. He wants to do the labs in [place] and I am mailing the orders to him. He said Ok toschedule the MRI/MRCP    ( SEE LESLIE'S NOTE ABOUT NOTIFYING RADIOLOGY OF PATIENTS RENAL INSUFFICIENCY) OK to schedule the Bone Density anytime. He did want Neil Crouch, PA to know that he has a kidney doctor, Dr. Petra Kuba in [place].  Sending to [place], [place], and [place].

## 2015-11-22 ENCOUNTER — Other Ambulatory Visit: Payer: Self-pay

## 2015-11-22 DIAGNOSIS — N281 Cyst of kidney, acquired: Secondary | ICD-10-CM

## 2015-11-22 DIAGNOSIS — K51919 Ulcerative colitis, unspecified with unspecified complications: Secondary | ICD-10-CM

## 2015-11-22 DIAGNOSIS — K8500 Idiopathic acute pancreatitis without necrosis or infection: Secondary | ICD-10-CM

## 2015-11-22 DIAGNOSIS — N289 Disorder of kidney and ureter, unspecified: Secondary | ICD-10-CM

## 2015-11-22 DIAGNOSIS — Z79899 Other long term (current) drug therapy: Secondary | ICD-10-CM

## 2015-11-22 DIAGNOSIS — IMO0001 Reserved for inherently not codable concepts without codable children: Secondary | ICD-10-CM

## 2015-11-22 NOTE — Progress Notes (Signed)
ON RECALL  °

## 2015-11-23 ENCOUNTER — Other Ambulatory Visit: Payer: Self-pay | Admitting: Gastroenterology

## 2015-11-23 DIAGNOSIS — K5100 Ulcerative (chronic) pancolitis without complications: Secondary | ICD-10-CM

## 2015-11-23 DIAGNOSIS — N189 Chronic kidney disease, unspecified: Secondary | ICD-10-CM

## 2015-11-23 DIAGNOSIS — Z7952 Long term (current) use of systemic steroids: Secondary | ICD-10-CM

## 2015-11-23 NOTE — Progress Notes (Signed)
Pt is set for bone density and MRI on 12/06/2015 @ 12:15pm.  Called pt and LMOM with appt and that pt needed to hold calcium 2 days prior to imaging and to bring full list of medications to exam.

## 2015-11-23 NOTE — Progress Notes (Signed)
Noted  

## 2015-12-06 ENCOUNTER — Other Ambulatory Visit: Payer: Self-pay | Admitting: Gastroenterology

## 2015-12-06 ENCOUNTER — Ambulatory Visit (HOSPITAL_COMMUNITY)
Admission: RE | Admit: 2015-12-06 | Discharge: 2015-12-06 | Disposition: A | Discharge status: Home / Self Care | Payer: Medicare Other | Source: Ambulatory Visit | Attending: Gastroenterology | Admitting: Gastroenterology

## 2015-12-06 DIAGNOSIS — K51919 Ulcerative colitis, unspecified with unspecified complications: Secondary | ICD-10-CM | POA: Diagnosis not present

## 2015-12-06 DIAGNOSIS — Z7952 Long term (current) use of systemic steroids: Secondary | ICD-10-CM | POA: Insufficient documentation

## 2015-12-06 DIAGNOSIS — N281 Cyst of kidney, acquired: Secondary | ICD-10-CM | POA: Diagnosis not present

## 2015-12-06 DIAGNOSIS — K5100 Ulcerative (chronic) pancolitis without complications: Secondary | ICD-10-CM | POA: Diagnosis not present

## 2015-12-06 DIAGNOSIS — R110 Nausea: Secondary | ICD-10-CM | POA: Insufficient documentation

## 2015-12-06 DIAGNOSIS — M8580 Other specified disorders of bone density and structure, unspecified site: Secondary | ICD-10-CM | POA: Diagnosis not present

## 2015-12-06 DIAGNOSIS — R1010 Upper abdominal pain, unspecified: Secondary | ICD-10-CM | POA: Insufficient documentation

## 2015-12-06 DIAGNOSIS — N189 Chronic kidney disease, unspecified: Secondary | ICD-10-CM | POA: Insufficient documentation

## 2015-12-06 DIAGNOSIS — K8500 Idiopathic acute pancreatitis without necrosis or infection: Secondary | ICD-10-CM

## 2015-12-06 DIAGNOSIS — N289 Disorder of kidney and ureter, unspecified: Secondary | ICD-10-CM

## 2015-12-06 LAB — CBC WITH DIFFERENTIAL/PLATELET
BASOS PCT: 0 % (ref 0–1)
Basophils Absolute: 0 10*3/uL (ref 0.0–0.1)
Eosinophils Absolute: 0 10*3/uL (ref 0.0–0.7)
Eosinophils Relative: 0 % (ref 0–5)
HEMATOCRIT: 33 % — AB (ref 39.0–52.0)
HEMOGLOBIN: 10.5 g/dL — AB (ref 13.0–17.0)
LYMPHS PCT: 25 % (ref 12–46)
Lymphs Abs: 1.8 10*3/uL (ref 0.7–4.0)
MCH: 27.4 pg (ref 26.0–34.0)
MCHC: 31.8 g/dL (ref 30.0–36.0)
MCV: 86.2 fL (ref 78.0–100.0)
MONO ABS: 0.7 10*3/uL (ref 0.1–1.0)
MONOS PCT: 10 % (ref 3–12)
MPV: 8.8 fL (ref 8.6–12.4)
NEUTROS ABS: 4.6 10*3/uL (ref 1.7–7.7)
NEUTROS PCT: 65 % (ref 43–77)
Platelets: 298 10*3/uL (ref 150–400)
RBC: 3.83 MIL/uL — AB (ref 4.22–5.81)
RDW: 16.5 % — ABNORMAL HIGH (ref 11.5–15.5)
WBC: 7.1 10*3/uL (ref 4.0–10.5)

## 2015-12-06 LAB — POCT I-STAT CREATININE: CREATININE: 1.4 mg/dL — AB (ref 0.61–1.24)

## 2015-12-06 MED ORDER — GADOBENATE DIMEGLUMINE 529 MG/ML IV SOLN
18.0000 mL | Freq: Once | INTRAVENOUS | Status: AC | PRN
  Administered 2015-12-06: 18 mL via INTRAVENOUS

## 2015-12-07 LAB — COMPREHENSIVE METABOLIC PANEL
ALBUMIN: 4.1 g/dL (ref 3.6–5.1)
ALK PHOS: 76 U/L (ref 40–115)
ALT: 16 U/L (ref 9–46)
AST: 22 U/L (ref 10–35)
BUN: 28 mg/dL — AB (ref 7–25)
CHLORIDE: 101 mmol/L (ref 98–110)
CO2: 23 mmol/L (ref 20–31)
CREATININE: 1.26 mg/dL — AB (ref 0.70–1.18)
Calcium: 9.1 mg/dL (ref 8.6–10.3)
Glucose, Bld: 108 mg/dL — ABNORMAL HIGH (ref 65–99)
Potassium: 3.8 mmol/L (ref 3.5–5.3)
SODIUM: 139 mmol/L (ref 135–146)
TOTAL PROTEIN: 6.7 g/dL (ref 6.1–8.1)
Total Bilirubin: 0.3 mg/dL (ref 0.2–1.2)

## 2015-12-07 LAB — LIPASE: LIPASE: 11 U/L (ref 7–60)

## 2015-12-11 NOTE — Progress Notes (Signed)
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  Pancreatic atrophy, mild. 1.6cm cystic lesion pancreatic tail and 16m pancreatic neck cystic lesion. Mild renal disease noted.   He will nee MRI Abd with/without contrast in 1 year at minimum. I will discuss with Dr. FOneida Alarto see if she recommends EUS. ______

## 2015-12-11 NOTE — Progress Notes (Signed)
Quick Note:  Osteopenia. But no osteoporosis.  Needs to take calcium 1285m daily and vitamin D 800 IU daily.  Repeat bone density study in 2 years.  Ideally we would get patient off prednisone but he has had refractory therapy ______

## 2015-12-11 NOTE — Progress Notes (Signed)
Quick Note:  Creatinine stable. Mild anemia. Anemia improved post-op. Back surgery 3 months ago.  We need to try and get his Delzicol down to 885m TID (2 TID) due to renal disease.  CBC, ferritin, iron/tibc 8 weeks.  Keep ov in 02/2016 with SLF. ______

## 2015-12-12 ENCOUNTER — Other Ambulatory Visit: Payer: Self-pay

## 2015-12-12 ENCOUNTER — Telehealth: Payer: Self-pay | Admitting: Gastroenterology

## 2015-12-12 DIAGNOSIS — D649 Anemia, unspecified: Secondary | ICD-10-CM

## 2015-12-12 NOTE — Telephone Encounter (Signed)
See result notes. Pt aware of all.

## 2015-12-12 NOTE — Progress Notes (Signed)
Quick Note:  Pt is aware of results. He is aware we will mail lab orders in 8 weeks. He is aware he is on recall for OV with Dr. Oneida Alar for April.  PT WOULD LIKE NEW PRESCRIPTION FOR THE NEW ORDERS ON DELZICOL FOR 90 DAY SUPPLY SENT TO [facility] IN [place]. ______

## 2015-12-12 NOTE — Progress Notes (Signed)
Quick Note:  LM for pt to call. ______

## 2015-12-12 NOTE — Progress Notes (Signed)
Quick Note:  Pt is aware of results and recommendations. ______

## 2015-12-12 NOTE — Progress Notes (Signed)
Quick Note:  Pt is aware. Sending to Manuela Schwartz to nic the MRI in one year. ______

## 2015-12-12 NOTE — Telephone Encounter (Signed)
Pt called for DS. Please call patient back at [phone]

## 2015-12-19 NOTE — Progress Notes (Signed)
Quick Note:  Louis Bowers, Did you send the new prescription to the pharmacy for the 90 day supply? ______

## 2015-12-24 ENCOUNTER — Other Ambulatory Visit: Payer: Self-pay

## 2015-12-24 DIAGNOSIS — D649 Anemia, unspecified: Secondary | ICD-10-CM

## 2015-12-31 ENCOUNTER — Telehealth: Payer: Self-pay

## 2015-12-31 ENCOUNTER — Telehealth: Payer: Self-pay | Admitting: Gastroenterology

## 2015-12-31 NOTE — Telephone Encounter (Signed)
Pt was returning DS call and asked if he could have his labs done in [place] at the [facility] there. I told him that DS would have to call him back. [phone]

## 2015-12-31 NOTE — Telephone Encounter (Signed)
Pt is aware to do the labs in March.

## 2015-12-31 NOTE — Telephone Encounter (Signed)
Pt left Vm he has questions about the lab orders he received to do for March. I called and got VM and tol him to return call.

## 2015-12-31 NOTE — Telephone Encounter (Signed)
Pt is aware OK to do his labs in [place].

## 2016-01-01 ENCOUNTER — Other Ambulatory Visit: Payer: Self-pay | Admitting: Gastroenterology

## 2016-01-01 MED ORDER — MESALAMINE 400 MG PO CPDR: 800.0000 mg | DELAYED_RELEASE_CAPSULE | Freq: Three times a day (TID) | ORAL | Status: DC

## 2016-01-01 NOTE — Progress Notes (Signed)
Quick Note:  Sorry Louis Bowers, I did not see this. RX done now. ______

## 2016-01-30 ENCOUNTER — Encounter: Payer: Self-pay | Admitting: Gastroenterology

## 2016-02-06 ENCOUNTER — Telehealth: Payer: Self-pay | Admitting: Gastroenterology

## 2016-02-06 MED ORDER — PREDNISONE 2.5 MG PO TABS: 2.5000 mg | ORAL_TABLET | Freq: Every day | ORAL | Status: DC

## 2016-02-06 NOTE — Telephone Encounter (Signed)
Sending to the refill box.  

## 2016-02-06 NOTE — Telephone Encounter (Signed)
Pt is asking for a refill on his predisone 2.71m prescription and [facility]

## 2016-02-06 NOTE — Telephone Encounter (Signed)
rx done

## 2016-02-06 NOTE — Addendum Note (Signed)
Addended by: Mahala Menghini on: 02/06/2016 01:31 PM   Modules accepted: Orders

## 2016-03-07 ENCOUNTER — Ambulatory Visit (INDEPENDENT_AMBULATORY_CARE_PROVIDER_SITE_OTHER): Payer: Medicare Other | Admitting: Gastroenterology

## 2016-03-07 VITALS — BP 123/80 | HR 84 | Temp 97.9°F | Ht 70.0 in | Wt 195.8 lb

## 2016-03-07 DIAGNOSIS — K861 Other chronic pancreatitis: Secondary | ICD-10-CM

## 2016-03-07 DIAGNOSIS — K5190 Ulcerative colitis, unspecified, without complications: Secondary | ICD-10-CM | POA: Diagnosis not present

## 2016-03-07 NOTE — Progress Notes (Signed)
Primary Care Physician: Andres Shad, MD  Primary Gastroenterologist:  Barney Drain, MD   Chief Complaint  Patient presents with  . Follow-up    HPI: Louis Bowers is a 81 y.o. male here For follow-up. He was last seen in December 2016. Since that time he has been able to reduce his delzicol to 800 mg 3 times a day. He remains on low-dose prednisone 2.5 mg daily. Currently dealing with first episode of gout. Sees his PCP this afternoon. Overall feels well from a GI standpoint. No blood in the stool. Bowel function regular. States he recently received an IV iron infusion. Coordinated by his Nephrologist. His hemoglobin has dramatically improved since his back surgery in October 2016.   MRCP/MRI abdomen January 2017 with cystic foci within the pancreas, measuring maximally 1.6 cm. Most likely pseudocyst. MRI with and without contrast recommended in one year as indolent cystic neoplasm cannot be excluded.  Current Outpatient Prescriptions  Medication Sig Dispense Refill  . acetaminophen (TYLENOL) 500 MG tablet Take by mouth.    . Ascorbic Acid (VITAMIN C WITH ROSE HIPS) 1000 MG tablet Take 1,200 mg by mouth daily.    . cholecalciferol (VITAMIN D) 400 units TABS tablet Take 800 Units by mouth.    . diphenhydrAMINE (BENADRYL) 25 mg capsule Take 25 mg by mouth daily.     Marland Kitchen ELIQUIS 5 MG TABS tablet Take 5 mg by mouth 2 (two) times daily.     . finasteride (PROSCAR) 5 MG tablet Take by mouth.    . Glucosamine Sulfate 500 MG CAPS Take by mouth.    . hydrochlorothiazide (HYDRODIURIL) 25 MG tablet Take 25 mg by mouth daily.     Marland Kitchen levothyroxine (SYNTHROID, LEVOTHROID) 50 MCG tablet Take 50 mcg by mouth daily before breakfast.     . Mesalamine (DELZICOL) 400 MG CPDR DR capsule Take 2 capsules (800 mg total) by mouth 3 (three) times daily. 540 capsule 3  . metoprolol (LOPRESSOR) 50 MG tablet Take 50 mg by mouth 3 (three) times daily.     Marland Kitchen PACERONE 100 MG tablet Take 100 mg by mouth  daily.     . Pancrelipase, Lip-Prot-Amyl, (CREON) 24000 UNITS CPEP Take 3 capsules (72,000 Units total) by mouth 3 (three) times daily. 810 capsule 3  . pantoprazole (PROTONIX) 40 MG tablet Take 40 mg by mouth daily.     . predniSONE (DELTASONE) 2.5 MG tablet Take 1 tablet (2.5 mg total) by mouth daily with breakfast. 90 tablet 1  . tamsulosin (FLOMAX) 0.4 MG CAPS capsule Take by mouth.     No current facility-administered medications for this visit.    Allergies as of 03/07/2016  . (No Known Allergies)    ROS:  General: Negative for anorexia, weight loss, fever, chills, fatigue, weakness. ENT: Negative for hoarseness, difficulty swallowing , nasal congestion. CV: Negative for chest pain, angina, palpitations, dyspnea on exertion, peripheral edema.  Respiratory: Negative for dyspnea at rest, dyspnea on exertion, cough, sputum, wheezing.  GI: See history of present illness. GU:  Negative for dysuria, hematuria, urinary incontinence, urinary frequency, nocturnal urination.  Endo: Negative for unusual weight change.    Physical Examination:   BP 123/80 mmHg  Pulse 84  Temp(Src) 97.9 F (36.6 C)  Ht 5' 10"  (1.778 m)  Wt 195 lb 12.8 oz (88.814 kg)  BMI 28.09 kg/m2  General: Well-nourished, well-developed in no acute distress.  Eyes: No icterus. Mouth: Oropharyngeal mucosa moist and pink , no lesions erythema or  exudate. Lungs: Clear to auscultation bilaterally.  Heart: Regular rate and rhythm, no murmurs rubs or gallops.  Abdomen: Bowel sounds are normal, nontender, nondistended, no hepatosplenomegaly or masses, no abdominal bruits or hernia , no rebound or guarding.   Extremities: No lower extremity edema. No clubbing or deformities. Neuro: Alert and oriented x 4   Skin: Warm and dry, no jaundice.   Psych: Alert and cooperative, normal mood and affect.  Labs:  Labs from 01/29/2016 iron 49 low, TIBC 308, iron saturation 16% low, ferritin 87.7, white blood cell count 6480,  hemoglobin 12 low, hematocrit 30.2 low, platelets 249,000  Imaging Studies: No results found.     D

## 2016-03-07 NOTE — Patient Instructions (Signed)
1. Please call when you are ready to schedule your colonoscopy.  2. Return to the office in 4 months to see Dr. Oneida Alar.

## 2016-03-12 ENCOUNTER — Encounter: Payer: Self-pay | Admitting: Gastroenterology

## 2016-03-12 NOTE — Assessment & Plan Note (Signed)
Clinically doing well. MRI with suspected pseudocyst but plans to follow-up in one year with MRI pancreas with well without contrast. Continue pancreatic enzymes as before. Return to the office to see Dr. Oneida Alar in 4 months.

## 2016-03-12 NOTE — Progress Notes (Signed)
Quick Note:  Dr. Oneida Alar, I saw this patient last week. Clinically doing well. Would you recommend MRI of pancreatic cystic lesions in one year as per radiology recommendations or do you see need for EUS now? ______

## 2016-03-12 NOTE — Assessment & Plan Note (Signed)
Clinically doing well on prednisone 2.5 mg daily along with Delzicol 800 mg 3 times a day. Mild renal insufficiency followed by nephrologist. Bone density test up-to-date. On calcium with vitamin D. He is due for a colonoscopy later this year. Will come back in 4 months to see Dr. Oneida Alar

## 2016-03-12 NOTE — Progress Notes (Signed)
cc'ed to pcp °

## 2016-03-18 ENCOUNTER — Telehealth: Payer: Self-pay

## 2016-03-18 NOTE — Progress Notes (Signed)
REVIEWED-NO ADDITIONAL RECOMMENDATIONS. 

## 2016-03-18 NOTE — Telephone Encounter (Signed)
PLEASE CALL PT. Our records indicate his last TCS was 2013. He had a simple adenoma removed. He needs a OPV to discuss indication for TCS. ADDITIONALLY I AM ON VACATION JUL 3-7, 10-14.

## 2016-03-18 NOTE — Telephone Encounter (Signed)
Pt is calling to set up TCS and he would like to have it on July 10 @ 8:30. I will hold a spot for him but he will need an updated H&P.

## 2016-03-18 NOTE — Telephone Encounter (Signed)
H/o adenomatous colon polyps, UC, last complete colonoscopy >10 years ago. ON ELIQUIS.lsat seen 03/07/16.  Dr. Oneida Alar, do you want [place] to bring back before July colonoscopy?

## 2016-03-19 ENCOUNTER — Other Ambulatory Visit: Payer: Self-pay

## 2016-03-19 ENCOUNTER — Encounter: Payer: Self-pay | Admitting: Gastroenterology

## 2016-03-19 NOTE — Telephone Encounter (Signed)
APPT MADE AND LETTER SENT  °

## 2016-03-19 NOTE — Telephone Encounter (Signed)
REVIEWED-NO ADDITIONAL RECOMMENDATIONS. 

## 2016-03-19 NOTE — Telephone Encounter (Signed)
Sorry, you are right. His last complete colonoscopy was NOT 10 years ago it WAS July 2012. He has flex sig in 11/2011 with scope advance to cecum due to new diagnosis of UC and to examine extent of disease, no mention of bowel prep.  Dr. Algis Greenhouse discussed with patient to consider TCS in 05/2016 for h/o polyps.   At this point, I would recommend patient consider OV with [facility]F to discuss if TCS indicated. It would be reasonable to hold a date for him for procedure and schedule OV with [facility]F prior to that date. There is NO RUSH to complete in the next two months unless patient has desire to. AND PER SLF SHE IS ON VACATION THE DATE WE ARE HOLDING FOR TCS. SEE HER NOTE.

## 2016-03-19 NOTE — Telephone Encounter (Signed)
Please make appointment.

## 2016-03-26 ENCOUNTER — Encounter: Payer: Self-pay | Admitting: Gastroenterology

## 2016-04-10 NOTE — Progress Notes (Addendum)
REVIEWED. REPEAT MRI IN JAN 2018.

## 2016-04-23 ENCOUNTER — Ambulatory Visit (INDEPENDENT_AMBULATORY_CARE_PROVIDER_SITE_OTHER): Payer: Medicare Other | Admitting: Gastroenterology

## 2016-04-23 ENCOUNTER — Encounter: Payer: Self-pay | Admitting: Gastroenterology

## 2016-04-23 VITALS — BP 101/69 | HR 57 | Temp 97.6°F | Ht 64.0 in | Wt 195.8 lb

## 2016-04-23 DIAGNOSIS — Z1211 Encounter for screening for malignant neoplasm of colon: Secondary | ICD-10-CM

## 2016-04-23 DIAGNOSIS — K861 Other chronic pancreatitis: Secondary | ICD-10-CM

## 2016-04-23 DIAGNOSIS — K5100 Ulcerative (chronic) pancolitis without complications: Secondary | ICD-10-CM

## 2016-04-23 NOTE — Progress Notes (Signed)
Subjective:    Patient ID: Louis Bowers, male    DOB: 11/28/1934, 81 y.o.   MRN: [id number]  Andres Shad, MD   HPI No rash anymore EXCEPT ON HIS BUTTOCKS. Taking low dose prednisone for colon. HAVING SURGERY FOR BLADDER POLYPS NEXT WEEK. FEELS GOOD AND BOWELS WORKING GOOD. CYSTOSCOPY SHOWED POLYPS IN HIS BLADDER. APPETITE: TOO GOOD. ENERGY LEVEL: GETTING BACK TO BASELINE SINCE BACK SURGERY IN OCT 2016. BMs:   PT DENIES FEVER, CHILLS, HEMATOCHEZIA, HEMATEMESIS, nausea, vomiting, melena, diarrhea, CHEST PAIN, SHORTNESS OF BREATH,  CHANGE IN BOWEL IN HABITS, constipation, abdominal pain, problems swallowing, OR heartburn or indigestion.   Past Medical History  Diagnosis Date  . A-fib ([place])     Dr. Rosalita Chessman  . Pancreatitis     Dr.  Oliva Bustard. Shifflett  . UC (ulcerative colitis) ([place])     Dr. Spainhour/Dr. Shifflett: diagnosed 11/2011 involved entire colon  . Osteoarthritis   . Bladder cancer ([place]) 2004    instilled chemo  . Chronic renal insufficiency, stage III (moderate)     [facility]   Past Surgical History  Procedure Laterality Date  . Colonoscopy N/A 2005 [place]    Dr. Nathaneil Canary Shiflett: 5 polyps removed from cecal area all were benign  . Esophagogastroduodenoscopy  2005 [place]    Dr. Nathaneil Canary Shiflett: normal except for antral gastritis  . Colonoscopy  05/2011    Dr. Nathaneil Canary Shiflett:no polyps, diverticulosis  . Back surgery  05/2003  . Bladder surgery  02/2009  . Back surgery  06/2014  . Flexible sigmoidoscopy  02/2012    Dr. Nathaneil Canary Shiflett: advance up to 60cm, entire left colon with intense UC flare, no bx as patient was on coumdin  . Flexible sigmoidoscopy  11/2011    Dr. Nathaneil Canary Shiflett: diffuse colitis universal, cecal polyp removed (adenoma)  . Eus  09/2011    [facility]: chronic pancreatitis vs chronic smodering pancreatitis  . Back surgery  08/2015   No Known Allergies  Current Outpatient Prescriptions  Medication Sig Dispense  Refill  . Ascorbic Acid (VITAMIN C WITH ROSE HIPS) 1000 MG tablet Take 1,200 mg by mouth daily.    . cholecalciferol (VITAMIN D) 400 units TABS tablet Take 800 Units by mouth.    . diphenhydrAMINE (BENADRYL) 25 mg capsule Take 25 mg by mouth daily.     Marland Kitchen ELIQUIS 5 MG TABS tablet Take 5 mg by mouth 2 (two) times daily.     . febuxostat (ULORIC) 40 MG tablet Take 40 mg by mouth daily.    . finasteride (PROSCAR) 5 MG tablet Take by mouth.    . Glucosamine Sulfate 500 MG CAPS Take by mouth.    . hydrochlorothiazide (HYDRODIURIL) 25 MG tablet Take 25 mg by mouth daily.     Marland Kitchen levothyroxine (SYNTHROID, LEVOTHROID) 50 MCG tablet Take 50 mcg by mouth daily before breakfast.     . Mesalamine (DELZICOL) 400 MG CPDR DR capsule Take 2 capsules PO 3 (three) times daily.    . metoprolol (LOPRESSOR) 50 MG tablet Take 50 mg by mouth 3 (three) times daily.     Marland Kitchen PACERONE 100 MG tablet Take 100 mg by mouth daily.     . Pancrelipase, Lip-Prot-Amyl, (CREON) 24000 UNITS CPEP Take 3 capsules (72,000 Units total) by mouth 3 (three) times daily.    . pantoprazole (PROTONIX) 40 MG tablet Take 40 mg by mouth daily.     . predniSONE (DELTASONE) 2.5 MG tablet Take 1 tablet ( PO QD daily with  breakfast.    . tamsulosin (FLOMAX) 0.4 MG CAPS capsule Take by mouth.    Marland Kitchen acetaminophen (TYLENOL) 500 MG tablet Take by mouth. Reported on 04/23/2016     Review of Systems PER HPI OTHERWISE ALL SYSTEMS ARE NEGATIVE.    Objective:   Physical Exam  Constitutional: He is oriented to person, place, and time. He appears well-developed and well-nourished. No distress.  HENT:  Head: Normocephalic and atraumatic.  Mouth/Throat: Oropharynx is clear and moist. No oropharyngeal exudate.  Eyes: Pupils are equal, round, and reactive to light. No scleral icterus.  Neck: Normal range of motion. Neck supple.  Cardiovascular: Normal rate, regular rhythm and normal heart sounds.   Pulmonary/Chest: Effort normal and breath sounds normal. No  respiratory distress.  Abdominal: Soft. Bowel sounds are normal. He exhibits no distension. There is no tenderness.  Musculoskeletal: He exhibits no edema.  Lymphadenopathy:    He has no cervical adenopathy.  Neurological: He is alert and oriented to person, place, and time.  NO NEW FOCAL DEFICITS  Psychiatric: He has a normal mood and affect.  Vitals reviewed.         Assessment & Plan:

## 2016-04-23 NOTE — Assessment & Plan Note (Addendum)
EXPLAINED TO PT WE DO NO ROUTINELY SCREEN AFTER THE AGE OF 77, BUT HE MAY CONSIDER TCS IN 3-5 YEARS FOR SURVEILLANCE DUE TO HIS Dx OF UC. PATIENT VOICED HIS UNDERSTANDING. WILL CALL WITH QUESTIONS OR CONCERNS.   GREATER THAN 50% WAS SPENT IN COUNSELING & COORDINATION OF CARE WITH THE PATIENT: DISCUSSED DIFFERENTIAL DIAGNOSIS, PROCEDURE, BENEFITS, RISKS, AND MANAGEMENT OF ULCERATIVE COLITIS/ PANCREATIC CYST, AND COLON CANCER SCREENING. TOTAL ENCOUNTER TIME: 25 MINS.

## 2016-04-23 NOTE — Progress Notes (Signed)
ON RECALL  °

## 2016-04-23 NOTE — Assessment & Plan Note (Signed)
SYMPTOMS CONTROLLED/RESOLVED ON 9 CREON A DAY.  CONTINUE TO MONITOR SYMPTOMS. CONTINUE CREON. REPEAT MRI PANCREAS INJAN 2018 FOLLOW UP IN 6 MOS.

## 2016-04-23 NOTE — Progress Notes (Signed)
CC'ED TO PCP 

## 2016-04-23 NOTE — Assessment & Plan Note (Signed)
SYMPTOMS CONTROLLED/RESOLVED ON PREDNISONE 2.5 MG DAILY FOR 4 YRS AND DELZICOL.  CONTINUE PREDNISONE. REVIEWED DEXA SCAN JAN 2017. CONTINUE CALCIUM AND VIT D. NO INDICATION FOR ENDOSCOPY AT THIS TIME. CONSIDER IN 3-5 YEARS UNLESS SYMPTOMS CHANGE. FOLLOW UP IN 6 MOS.

## 2016-04-23 NOTE — Patient Instructions (Signed)
CONTINUE PREDNISONE AND DELZICOL.  CONTINUE CREON. REPEAT MRCP IN JAN 2018.  PLEASE CALL WITH QUESTIONS OR CONCERNS: RECTAL BLEEDING, ABDOMINAL PAIN, WEIGHT LOSS, OR CHANGE IN BOWEL HABITS AND WE DISCUSS THE NEED FOR A COLONOSCOPY BEFORE 3-5 YEARS.  FOLLOW UP IN 6 MOS.

## 2016-05-06 NOTE — Progress Notes (Signed)
Quick Note:  sLF recommends MRI 11/2016. See addendum to ov note. ______

## 2016-06-16 NOTE — Progress Notes (Signed)
ON RECALL  °

## 2016-06-16 NOTE — Progress Notes (Signed)
Please NIC for MRI abd/MRCP with contrast for 11/2016 per SLF (see below). Dx: abnormal pancreas.

## 2016-07-04 ENCOUNTER — Other Ambulatory Visit: Payer: Self-pay

## 2016-07-04 MED ORDER — PANCRELIPASE (LIP-PROT-AMYL) 24000-76000 UNITS PO CPEP: 72000.0000 [IU] | ORAL_CAPSULE | Freq: Three times a day (TID) | ORAL | 11 refills | Status: DC

## 2016-07-08 ENCOUNTER — Other Ambulatory Visit: Payer: Self-pay | Admitting: Gastroenterology

## 2016-07-08 MED ORDER — PANCRELIPASE (LIP-PROT-AMYL) 36000-114000 UNITS PO CPEP: ORAL_CAPSULE | ORAL | 11 refills | Status: DC

## 2016-07-08 NOTE — Progress Notes (Signed)
Please let patient know that we are changing his creon dose to decrease the amount of pills he has to take daily and per pharmacy request due to matching number of pills per stock bottle. They cannot split the stock bottles open.   He will now be taking Creon 36,000 strength but take only two with breakfast, lunch, supper, and one with snacks.

## 2016-07-09 NOTE — Progress Notes (Signed)
LMOM to call.

## 2016-07-09 NOTE — Progress Notes (Signed)
Pt is aware and said next time he is going to try to get a 90 day supply at once if he can.

## 2016-07-30 ENCOUNTER — Telehealth: Payer: Self-pay | Admitting: Gastroenterology

## 2016-07-30 MED ORDER — PANCRELIPASE (LIP-PROT-AMYL) 36000-114000 UNITS PO CPEP: ORAL_CAPSULE | ORAL | 3 refills | Status: DC

## 2016-07-30 NOTE — Telephone Encounter (Signed)
Pt called wanting to speak with LSL about his prescription and how he gets it from a mailing company.  I offered him the nurse's voice mail, but he said he would rather speak with LSL so he could explain things. [phone]

## 2016-07-30 NOTE — Telephone Encounter (Signed)
RX done.

## 2016-07-30 NOTE — Telephone Encounter (Signed)
I spoke to pt and he said he would like to have refills on his Creon. He takes 6 tablets daily.  To get 90 day supply he would need #540.  However, the mail order will only ship in lots of #100 pills.  He would like to have #600 pills if possible.  His mail order is with [facility], so just sent to them.

## 2016-07-30 NOTE — Addendum Note (Signed)
Addended by: Mahala Menghini on: 07/30/2016 10:10 AM   Modules accepted: Orders

## 2016-07-30 NOTE — Telephone Encounter (Signed)
PT is aware.

## 2016-08-06 ENCOUNTER — Other Ambulatory Visit: Payer: Self-pay | Admitting: Gastroenterology

## 2016-09-17 ENCOUNTER — Encounter: Payer: Self-pay | Admitting: Gastroenterology

## 2016-10-09 ENCOUNTER — Encounter: Payer: Self-pay | Admitting: Gastroenterology

## 2016-10-09 ENCOUNTER — Ambulatory Visit (INDEPENDENT_AMBULATORY_CARE_PROVIDER_SITE_OTHER): Payer: Medicare Other | Admitting: Gastroenterology

## 2016-10-09 DIAGNOSIS — K861 Other chronic pancreatitis: Secondary | ICD-10-CM | POA: Diagnosis not present

## 2016-10-09 DIAGNOSIS — K5100 Ulcerative (chronic) pancolitis without complications: Secondary | ICD-10-CM | POA: Diagnosis not present

## 2016-10-09 NOTE — Assessment & Plan Note (Signed)
SYMPTOMS CONTROLLED/RESOLVED.  CONTINUE CREON. FOLLOW UP IN 2 MOS.

## 2016-10-09 NOTE — Progress Notes (Signed)
Subjective:    Patient ID: Louis Bowers, male    DOB: 05-29-1935, 81 y.o.   MRN: [id number]  Andres Shad, MD   HPI Still on prednisone 2.5 mg daily and DELZICOL 2 TID. NOW  HAVING TROUBLE WITH ARTHRITIS IN HIPS, KNEES, AND SHOULDERS. HAVING  BM 1-2X/DAY. EATS FRUITS AND VEGETABLES. GOUT PRETTY WELL CONTROLLED. NEEDS FLU SHOT AND PNEUMONIA. HAD SHINGLES SHOT. NO TB OR TB EXPOSURE. NO KNOW HEP ABC EXPOSURE-NO BLOOD TRANSFUSION/TATTOOS.   PT DENIES FEVER, CHILLS, HEMATOCHEZIA, HEMATEMESIS, nausea, vomiting, melena, diarrhea, CHEST PAIN, SHORTNESS OF BREATH,  CHANGE IN BOWEL IN HABITS, constipation, abdominal pain, problems swallowing, problems with sedation, heartburn or indigestion.  Past Medical History:  Diagnosis Date  . A-fib ([place])    Dr. Rosalita Chessman  . Bladder cancer ([place]) 2004   instilled chemo  . Chronic renal insufficiency, stage III (moderate)    [facility]  . Osteoarthritis   . Pancreatitis    Dr.  Oliva Bustard. Shifflett  . UC (ulcerative colitis) ([place])    Dr. Spainhour/Dr. Shifflett: diagnosed 11/2011 involved entire colon    Past Surgical History:  Procedure Laterality Date  . BACK SURGERY  05/2003  . BACK SURGERY  06/2014  . BACK SURGERY  08/2015  . BLADDER SURGERY  02/2009  . COLONOSCOPY N/A 2005 [place]   Dr. Nathaneil Canary Shiflett: 5 polyps removed from cecal area all were benign  . COLONOSCOPY  05/2011   Dr. Nathaneil Canary Shiflett:no polyps, diverticulosis  . ESOPHAGOGASTRODUODENOSCOPY  2005 [place]   Dr. Nathaneil Canary Shiflett: normal except for antral gastritis  . EUS  09/2011   [facility]: chronic pancreatitis vs chronic smodering pancreatitis  . FLEXIBLE SIGMOIDOSCOPY  02/2012   Dr. Nathaneil Canary Shiflett: advance up to 60cm, entire left colon with intense UC flare, no bx as patient was on coumdin  . FLEXIBLE SIGMOIDOSCOPY  11/2011   Dr. Nathaneil Canary Shiflett: diffuse colitis universal, cecal polyp removed (adenoma)    No Known Allergies  Current Outpatient  Prescriptions  Medication Sig Dispense Refill  . acetaminophen  500 MG tablet Take by mouth. Reported on 04/23/2016    . (VITAMIN C WITH ROSE HIPS)  tablet Take 1,200 mg by mouth daily.    .  (CALCIUM 1200 PO) Take 1 capsule by mouth daily. One daily    . VITAMIN D 400 units TABS tablet Take 800 Units by mouth.     BENADRYL Take 25 mg by mouth daily.     Marland Kitchen ELIQUIS 5 MG TABS tablet Take 5 mg by mouth 2 (two) times daily.     . febuxostat (ULORIC) 40 MG tablet Take 40 mg by mouth daily.    . finasteride (PROSCAR) 5 MG tablet Take by mouth.    . Glucosamine Sulfate 500 MG CAPS Take by mouth.    . hydrochlorothiazide  25 MG tablet Take 25 mg by mouth daily.     Marland Kitchen SYNTHROID 50 MCG tablet Take 50 mcg by mouth daily before breakfast.     . CREON) 36000 UNITS CPEP capsule Take two with breakfast, with lunch, AND with supper.    . (DELZICOL 400 MG CPDR DR capsule Take 800 mg total by mouth 3 (three) times daily.    . metoprolol  50 MG tablet Take 50 mg by mouth 3 (three) times daily.     Marland Kitchen PACERONE 100 MG tablet Take 100 mg by mouth daily.     . pantoprazole  40 MG tablet Take 40 mg by mouth daily.     Marland Kitchen  predniSONE 2.5 MG tablet TAKE ONE TABLET ONCE DAILY WITH BREAKFAST    . tamsulosin  0.4 MG CAPS capsule Take by mouth.     Review of Systems PER HPI OTHERWISE ALL SYSTEMS ARE NEGATIVE.    Objective:   Physical Exam        Assessment & Plan:

## 2016-10-09 NOTE — Progress Notes (Signed)
cc'ed to pcp °

## 2016-10-09 NOTE — Patient Instructions (Addendum)
Complete flu and pneumonia shot.  Complete blood draw to CHECK FOR TB AND VIRAL HEPATITIS.  WE WILL START HUMIRA IF YOUR LABS ARE NEGATIVE.  REDUCE PREDNISONE TO 2 MG DAILY FOR 2 WEEKS THEN GO DOWN TO 1 MG DAILY. PLEASE CALL WITH QUESTIONS OR CONCERNS.  FOLLOW UP IN 2 MOS.

## 2016-10-09 NOTE — Progress Notes (Signed)
CC'D TO PCP °

## 2016-10-09 NOTE — Assessment & Plan Note (Signed)
SYMPTOMS CONTROLLED/RESOLVED ON PREDNISONE AND MESALAMINE.  EXPLAINED TO PT HE SHOULD BE OFF PREDNISONE DUE TO SIDE EFFECTS AND OFF MESALAMINE DUE TO RENAL INSUFFICIENCY.  Complete flu and pneumonia shot. Complete blood draw to CHECK FOR TB AND VIRAL HEPATITIS. WE WILL START HUMIRA IF  LABS ARE NEGATIVE. REDUCE PREDNISONE TO 2 MG DAILY FOR 2 WEEKS THEN GO DOWN T 1 MG DAILY. PLEASE CALL WITH QUESTIONS OR CONCERNS. FOLLOW UP IN 2 MOS.

## 2016-10-21 ENCOUNTER — Encounter: Payer: Self-pay | Admitting: Gastroenterology

## 2016-10-21 ENCOUNTER — Telehealth: Payer: Self-pay | Admitting: Gastroenterology

## 2016-10-21 NOTE — Telephone Encounter (Signed)
Letter mailed

## 2016-10-21 NOTE — Telephone Encounter (Signed)
Recall for mri abd/mrcp

## 2016-10-28 ENCOUNTER — Other Ambulatory Visit: Payer: Self-pay

## 2016-10-28 DIAGNOSIS — K869 Disease of pancreas, unspecified: Secondary | ICD-10-CM

## 2016-10-30 ENCOUNTER — Ambulatory Visit: Payer: Medicare Other | Admitting: Gastroenterology

## 2016-11-13 ENCOUNTER — Encounter: Payer: Self-pay | Admitting: Gastroenterology

## 2016-11-13 ENCOUNTER — Other Ambulatory Visit: Payer: Self-pay | Admitting: Gastroenterology

## 2016-11-13 ENCOUNTER — Ambulatory Visit (INDEPENDENT_AMBULATORY_CARE_PROVIDER_SITE_OTHER): Payer: Medicare Other | Admitting: Gastroenterology

## 2016-11-13 ENCOUNTER — Ambulatory Visit (HOSPITAL_COMMUNITY)
Admission: RE | Admit: 2016-11-13 | Discharge: 2016-11-13 | Disposition: A | Discharge status: Home / Self Care | Payer: Medicare Other | Source: Ambulatory Visit | Attending: Gastroenterology | Admitting: Gastroenterology

## 2016-11-13 DIAGNOSIS — K869 Disease of pancreas, unspecified: Secondary | ICD-10-CM | POA: Insufficient documentation

## 2016-11-13 DIAGNOSIS — I7090 Unspecified atherosclerosis: Secondary | ICD-10-CM | POA: Diagnosis not present

## 2016-11-13 DIAGNOSIS — N281 Cyst of kidney, acquired: Secondary | ICD-10-CM | POA: Insufficient documentation

## 2016-11-13 DIAGNOSIS — Z1211 Encounter for screening for malignant neoplasm of colon: Secondary | ICD-10-CM | POA: Diagnosis not present

## 2016-11-13 DIAGNOSIS — Q453 Other congenital malformations of pancreas and pancreatic duct: Secondary | ICD-10-CM | POA: Diagnosis not present

## 2016-11-13 DIAGNOSIS — K861 Other chronic pancreatitis: Secondary | ICD-10-CM | POA: Diagnosis not present

## 2016-11-13 DIAGNOSIS — K7689 Other specified diseases of liver: Secondary | ICD-10-CM | POA: Insufficient documentation

## 2016-11-13 DIAGNOSIS — K5100 Ulcerative (chronic) pancolitis without complications: Secondary | ICD-10-CM

## 2016-11-13 LAB — POCT I-STAT CREATININE: CREATININE: 1.3 mg/dL — AB (ref 0.61–1.24)

## 2016-11-13 IMAGING — MR MR 3D RECON AT [facility]
19 of 21 series · 19 of 21 positions shown · IV contrast (multihance)
Comparison: [DATE]

CLINICAL DATA: Bladder cancer.  Pancreatic cystic lesions.

EXAM:
MRI ABDOMEN WITHOUT AND WITH CONTRAST
TECHNIQUE: Multiplanar multisequence MR imaging of the abdomen was performed
both before and after the administration of intravenous contrast.
CONTRAST:  18mL MULTIHANCE GADOBENATE DIMEGLUMINE 529 MG/ML IV SOLN

[Series 4: T2 · coronal · 5.0mm · 1.28mm/px · 1 of 40 slices shown (1 of 3)]
[im 1/40]
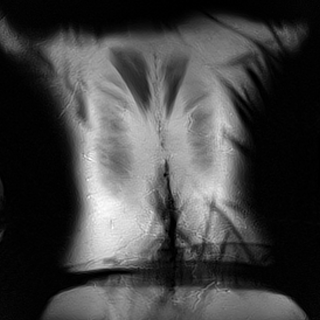

[Series 6: t2fs axial · axial · 5.0mm · 1.19mm/px · 1 of 40 slices shown]
[im 1/40]
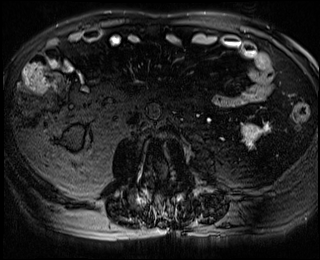

[Series 7: T1 · axial · 4.0mm · 0.59mm/px · 1 of 64 slices shown (1 of 3)]
[im 1/64]
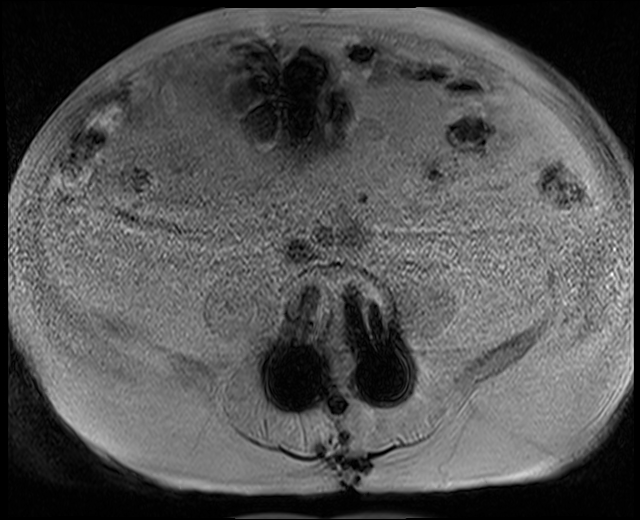

[Series 8: T1 · axial · 4.0mm · 0.59mm/px · 1 of 64 slices shown (2 of 3)]
[im 1/64]
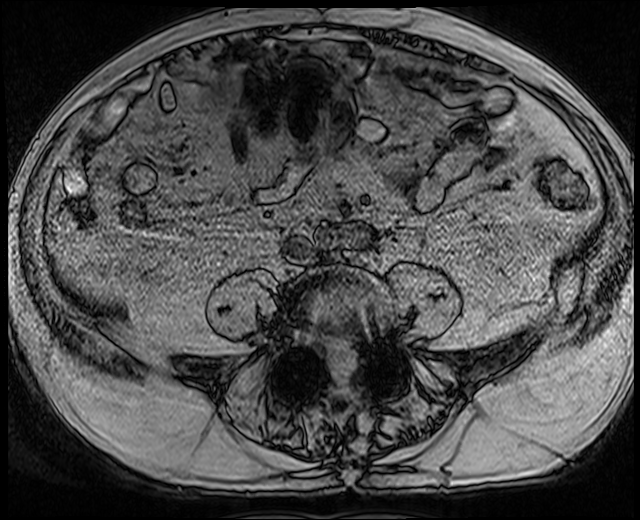

[Series 10: T1 · axial · 4.0mm · 0.59mm/px · 1 of 64 slices shown (3 of 3)]
[im 1/64]
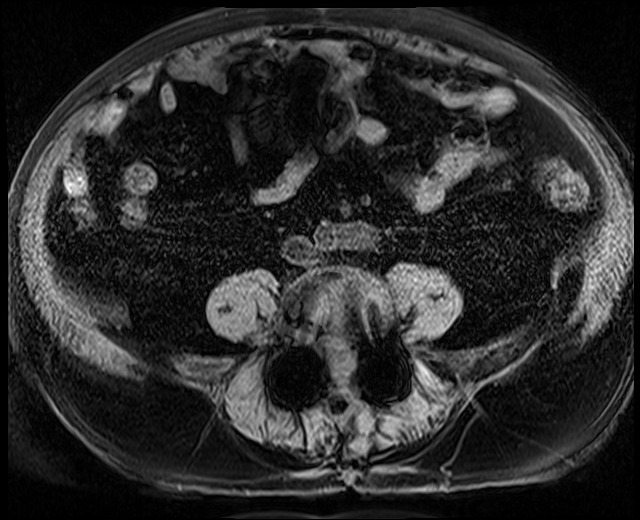

[Series 14: bSSFP · axial · 4.0mm · 0.68mm/px · 1 of 70 slices shown]
[im 1/70]
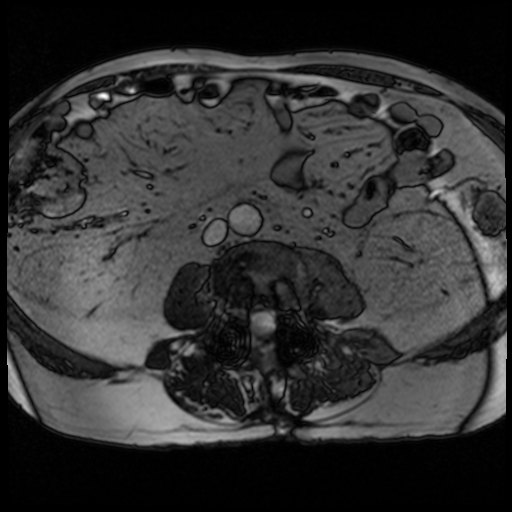

[Series 15: DWI · axial · 5.0mm · 0.99mm/px · 1 of 79 slices shown (1 of 2)]
[im 1/79]
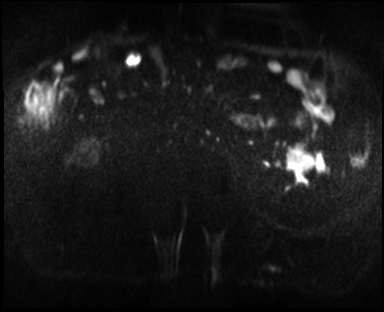

[Series 16: DWI · axial · 5.0mm · 0.99mm/px · 1 of 40 slices shown (2 of 2)]
[im 1/40]
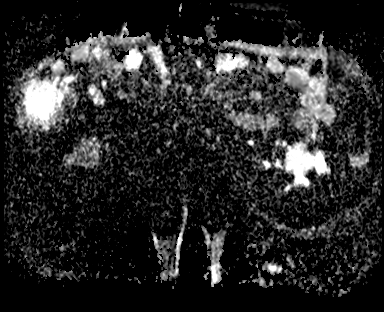

[Series 17: T2 · axial · 3.0mm · 0.69mm/px · 1 of 40 slices shown (2 of 3)]
[im 1/40]
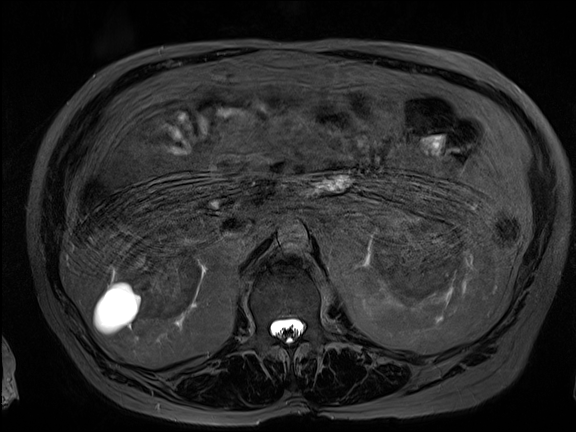

[Series 18: pre test · axial · non-contrast · 3.5mm · 0.62mm/px · 1 of 72 slices shown]
[im 1/72]
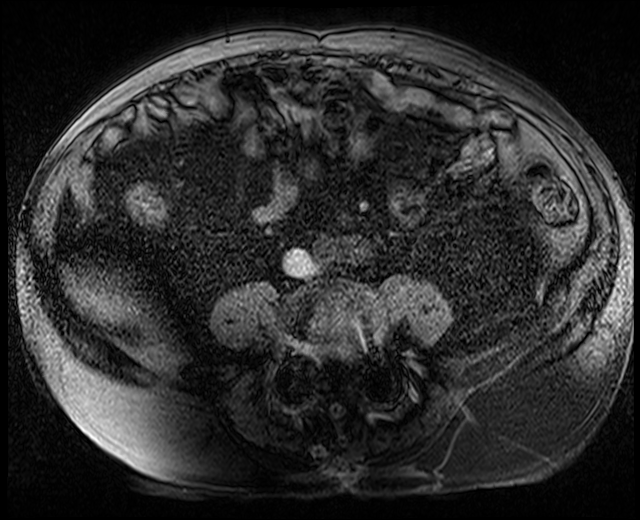

[Series 26: t1fs coronal post · coronal · 2.5mm · 1.39mm/px · 1 of 104 slices shown]
[im 1/104]
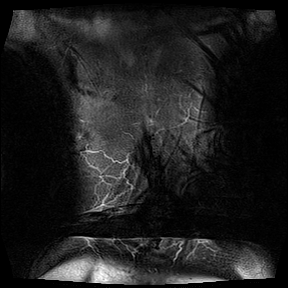

[Series 27: T2 · axial · 5.0mm · 1.48mm/px · 1 of 40 slices shown (3 of 3)]
[im 1/40]
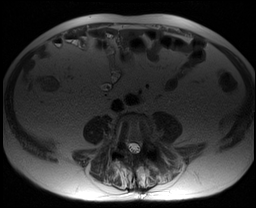

[Series 28: 5 min delay · axial · delayed · 3.5mm · 0.62mm/px · 1 of 72 slices shown]
[im 1/72]
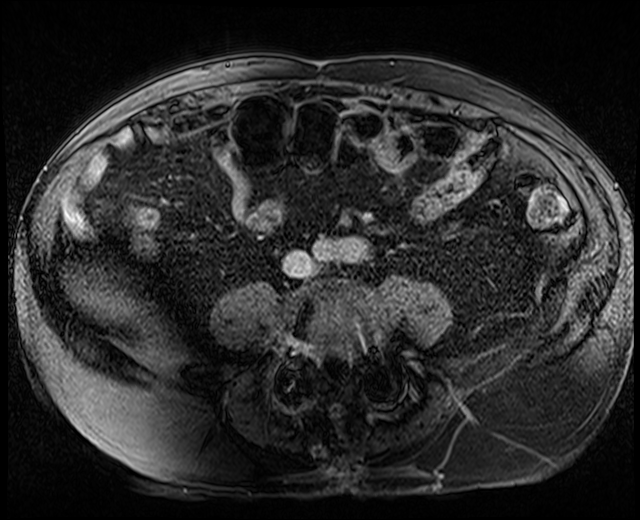

[Series 29: 5 min delay_sub · axial · 3.5mm · 0.62mm/px · 1 of 72 slices shown]
[im 1/72]
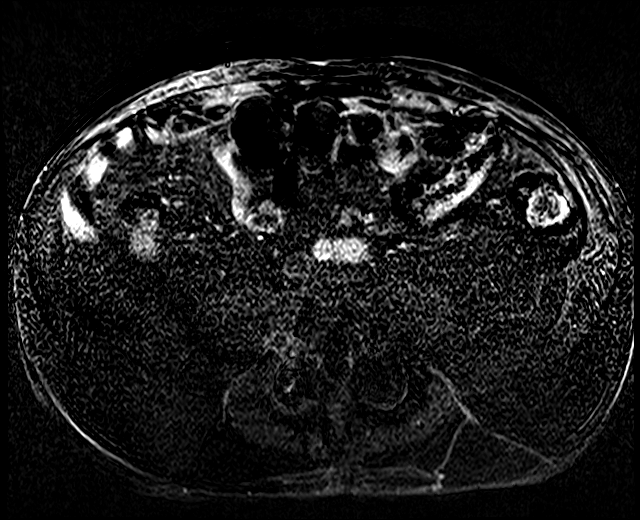

[Series 5004: (id) · axial · 3.5mm · 0.62mm/px · 1 of 72 slices shown (1 of 5)]
[im 1/72]
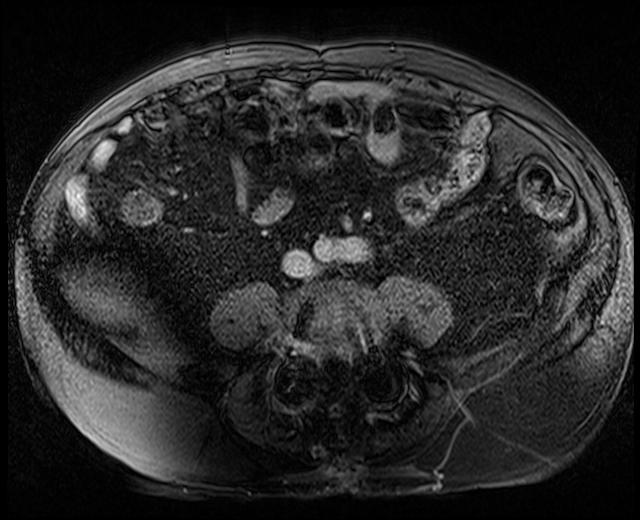

[Series 5005: (id) · axial · 3.5mm · 0.62mm/px · 1 of 72 slices shown (2 of 5)]
[im 1/72]
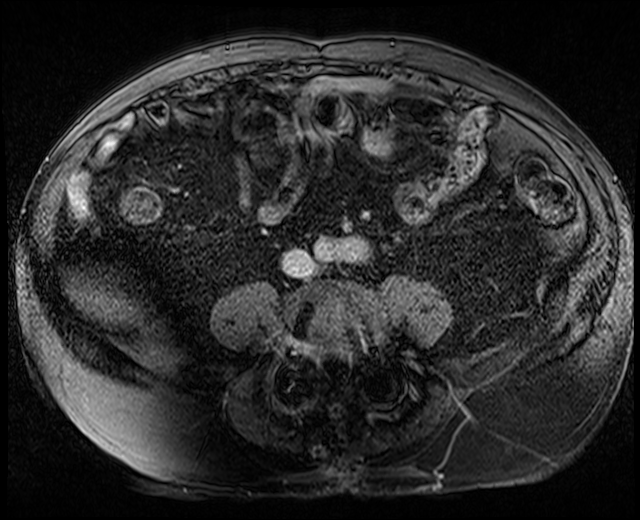

[Series 5006: (id) · axial · 3.5mm · 0.62mm/px · 1 of 72 slices shown (3 of 5)]
[im 1/72]
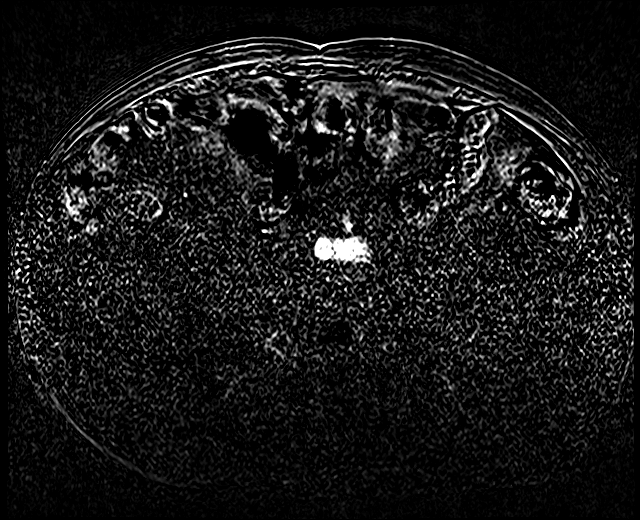

[Series 5007: (id) · axial · 3.5mm · 0.62mm/px · 1 of 72 slices shown (4 of 5)]
[im 1/72]
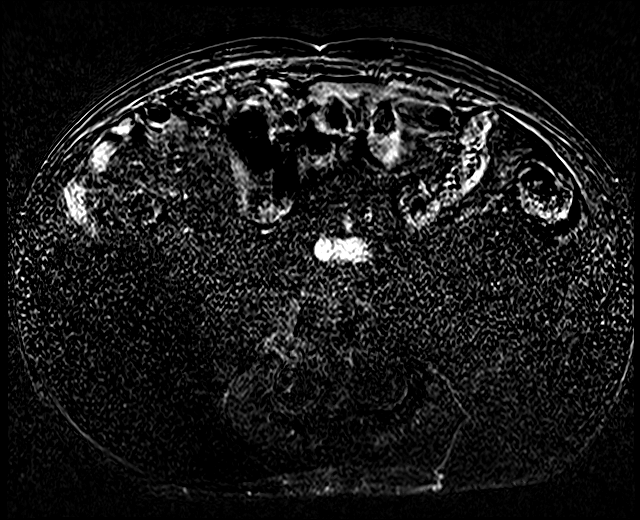

[Series 5008: (id) · axial · 3.5mm · 0.62mm/px · 1 of 72 slices shown (5 of 5)]
[im 1/72]
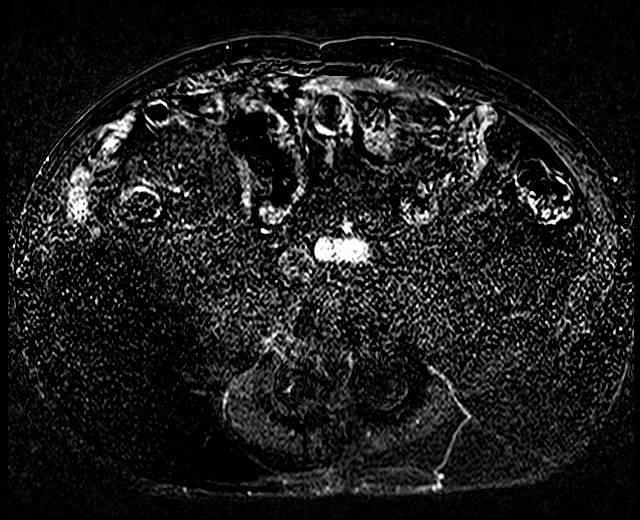

[19 of 21 positions shown; findings below may reference images not displayed]

FINDINGS: Lower chest: Unremarkable

Hepatobiliary: 3.7 by 2.7 cm segment 3 cyst shown on image 34/14.
Scattered additional T2 signal hyperintensities are scattered
throughout the liver, none of these enhance, these may reflect tiny
cysts or bile duct hamartomas. Biliary tree unremarkable aside from
absent gallbladder.

Pancreas: Several tiny dilated side ducts along the main pancreatic
duct. Small cystic lesion along the pancreatic tail measures 1.3 by
0.7 by 0.7 cm and has no associated enhancement. Pancreas divisum.

Spleen:  Unremarkable

Adrenals/Urinary Tract: 3 cm Bosniak category 2 cyst of the left
kidney upper pole, additional Bosniak category 1 cyst of the right
mid kidney. Adrenal glands normal.

Stomach/Bowel: Unremarkable

Vascular/Lymphatic: Intimal thickening and suspected atherosclerosis
of the abdominal aorta, no aneurysm or critical major vessel
stenosis identified. No adenopathy.

Other:  No supplemental non-categorized findings.

Musculoskeletal: Posterolateral rod and pedicle screw fixation in
the lumbar spine.
IMPRESSION: 1. The pancreatic cystic lesion currently measures 1.3 by 0.7 by
cm, and has not grown compared to prior. The patient is
approximately 82 years old and we have currently documented 1 year
stability. Based on current guidelines, I recommend follow up pre
and post contrast MRI/MRCP or pancreatic protocol CT in 2 years.
This recommendation follows ACR consensus guidelines: Management of
Incidental Pancreatic Cysts: A White Paper of the ACR Incidental
Findings Committee. [HOSPITAL] [EP];[DATE].
2. Left hepatic lobe cyst. Multiple additional relatively stable
small cysts or bile duct hamartomas.
3. Pancreas divisum.
4. Benign renal cysts.
5. Atherosclerosis.

## 2016-11-13 MED ORDER — GADOBENATE DIMEGLUMINE 529 MG/ML IV SOLN
18.0000 mL | Freq: Once | INTRAVENOUS | Status: AC | PRN
  Administered 2016-11-13: 18 mL via INTRAVENOUS

## 2016-11-13 NOTE — Assessment & Plan Note (Signed)
NO WARNING SIGNS/SYMPTOMS.  TCS UP TO DATE.

## 2016-11-13 NOTE — Patient Instructions (Addendum)
START PREDNISONE EVERY OTHER DAY FOR 14 DAYS AND THEN STOP.  PLEASE CALL WITH QUESTIONS OR CONCERNS.  COMPLETE MRCP.  I WILL GET LABS FROM [facility].  CONTINUE DELZICOL.    FOLLOW UP IN 6 MOS.

## 2016-11-13 NOTE — Assessment & Plan Note (Signed)
SYMPTOMS CONTROLLED/RESOLVED EVEN IN SETTING OF STEROID TAPER. DEXA UP TO DATE.  CONTINUE STEROID TAPER. START PREDNISONE EVERY OTHER DAY FOR 14 DAYS AND THEN STOP. CALL WITH QUESTIONS OR CONCERNS. GET LABS FROM [facility]. DECLINES HUMIRA AT THIS TIME. CONTINUE DELZICOL. MED SIDE EFFECTS DISCUSSED-DAMAGE KIDNEYS. FOLLOWED BY NEPHROLOGY.  FOLLOW UP IN 6 MOS.

## 2016-11-13 NOTE — Progress Notes (Signed)
CC'ED TO PCP 

## 2016-11-13 NOTE — Assessment & Plan Note (Signed)
IMAGINE JAN 2017-CYSTIC LESION.   COMPLETE MRCP TODAY. I WILL CONTACT HIMEWITH YOUR RESULTS WITHIN 48 HR.

## 2016-11-13 NOTE — Progress Notes (Signed)
Subjective:    Patient ID: Louis Bowers, male    DOB: 1935-01-16, 82 y.o.   MRN: [id number]  Andres Shad, MD  HPI Has some discomfort off AND ON. BOWELS HAVE BEEN GOOD(1-2/DAY). MOTHER IN LAW RECENTLY PASSED. NOW DOWN TO PREDNISONE 1 MG DAILY. GOT FLU SHOT AND PNEUMONIA. EATING FRUIT AND HAD NO SYMPTOMS. BLOOD WORK WITH DR [place]. LABS DRAWN AT [facility]. DEXA UP TO DATE. WOULD LIKE TO TRY AND NOT TAKE HUMIRA.   PT DENIES FEVER, CHILLS, HEMATOCHEZIA, HEMATEMESIS, nausea, vomiting, melena, diarrhea, CHEST PAIN, SHORTNESS OF BREATH,  CHANGE IN BOWEL IN HABITS, constipation, abdominal pain, problems swallowing, problems with sedation, heartburn or indigestion.  Past Medical History:  Diagnosis Date  . A-fib ([place])    Dr. Rosalita Chessman  . Bladder cancer ([place]) 2004   instilled chemo  . Chronic renal insufficiency, stage III (moderate)    [facility]  . Osteoarthritis   . Pancreatitis    Dr.  Oliva Bustard. Shifflett  . UC (ulcerative colitis) ([place])    Dr. Spainhour/Dr. Shifflett: diagnosed 11/2011 involved entire colon    Past Surgical History:  Procedure Laterality Date  . BACK SURGERY  05/2003  . BACK SURGERY  06/2014  . BACK SURGERY  08/2015  . BLADDER SURGERY  02/2009  . COLONOSCOPY N/A 2005 [place]   Dr. Nathaneil Canary Shiflett: 5 polyps removed from cecal area all were benign  . COLONOSCOPY  05/2011   Dr. Nathaneil Canary Shiflett:no polyps, diverticulosis  . ESOPHAGOGASTRODUODENOSCOPY  2005 [place]   Dr. Nathaneil Canary Shiflett: normal except for antral gastritis  . EUS  09/2011   [facility]: chronic pancreatitis vs chronic smodering pancreatitis  . FLEXIBLE SIGMOIDOSCOPY  02/2012   Dr. Nathaneil Canary Shiflett: advance up to 60cm, entire left colon with intense UC flare, no bx as patient was on coumdin  . FLEXIBLE SIGMOIDOSCOPY  11/2011   Dr. Nathaneil Canary Shiflett: diffuse colitis universal, cecal polyp removed (adenoma)    No Known Allergies  Current Outpatient  Prescriptions  Medication Sig Dispense Refill  . acetaminophen (TYLENOL) 500 MG tablet Take 1,000 mg by mouth as needed. Reported on 04/23/2016    . Ascorbic Acid (VITAMIN C WITH ROSE HIPS) 1000 MG tablet Take 1,200 mg by mouth daily.    . Calcium Carbonate-Vit D-Min (CALCIUM 1200 PO) Take 1 capsule by mouth daily. One daily    . cholecalciferol (VITAMIN D) 400 units TABS tablet Take 800 Units by mouth daily.     Marland Kitchen ELIQUIS 5 MG TABS tablet Take 5 mg by mouth 2 (two) times daily.     . febuxostat (ULORIC) 40 MG tablet Take 40 mg by mouth daily.    . finasteride (PROSCAR) 5 MG tablet Take 5 mg by mouth daily.     . Glucosamine Sulfate 500 MG CAPS Take by mouth 2 (two) times daily.     . hydrochlorothiazide (HYDRODIURIL) 25 MG tablet Take 25 mg by mouth daily.     Marland Kitchen levothyroxine (SYNTHROID, LEVOTHROID) 50 MCG tablet Take 50 mcg by mouth daily before breakfast.     . lipase/protease/amylase (CREON) 36000 UNITS CPEP capsule Take two with breakfast, two with lunch, two with supper.    . Mesalamine (DELZICOL) 400 MG CPDR DR capsule Take 2 capsules (800 mg total) by mouth 3 (three) times daily.    . metoprolol (LOPRESSOR) 50 MG tablet Take 50 mg by mouth 3 (three) times daily.     Marland Kitchen PACERONE 100 MG tablet Take 100 mg by mouth  daily.     . pantoprazole (PROTONIX) 40 MG tablet Take 40 mg by mouth daily.     . predniSONE (DELTASONE) 1 MG tablet Take 1 mg by mouth daily.    . diphenhydrAMINE (BENADRYL) 25 mg capsule Take 25 mg by mouth daily.     .      . tamsulosin (FLOMAX) 0.4 MG CAPS capsule Take 0.4 mg by mouth daily.      Review of Systems PER HPI OTHERWISE ALL SYSTEMS ARE NEGATIVE.    Objective:   Physical Exam  Constitutional: He is oriented to person, place, and time. He appears well-developed and well-nourished. No distress.  HENT:  Head: Normocephalic and atraumatic.  Mouth/Throat: Oropharynx is clear and moist. No oropharyngeal exudate.  Eyes: Pupils are equal, round, and reactive to  light. No scleral icterus.  Neck: Normal range of motion. Neck supple.  Cardiovascular: Normal rate, regular rhythm and normal heart sounds.   Pulmonary/Chest: Effort normal and breath sounds normal. No respiratory distress.  Abdominal: Soft. Bowel sounds are normal. He exhibits no distension. There is no tenderness.  Musculoskeletal: He exhibits no edema.  Lymphadenopathy:    He has no cervical adenopathy.  Neurological: He is alert and oriented to person, place, and time.  Psychiatric: He has a normal mood and affect.  Vitals reviewed.     Assessment & Plan:

## 2016-11-13 NOTE — Progress Notes (Signed)
On recall  °

## 2016-11-18 ENCOUNTER — Encounter: Payer: Self-pay | Admitting: Gastroenterology

## 2016-11-18 ENCOUNTER — Telehealth: Payer: Self-pay | Admitting: Gastroenterology

## 2016-11-18 NOTE — Telephone Encounter (Signed)
PLEASE CALL PT. His mri shows a stable pancreas cyst. It did not change in size. He needs a repeat MRI in 2 years. HE DID HAVE BENIGN LIVER AND KIDNEY CYSTS AND PANCREAS DIVISUM.

## 2016-11-18 NOTE — Telephone Encounter (Signed)
ON RECALL AND MRI CC'D TO PCP

## 2016-11-18 NOTE — Telephone Encounter (Signed)
PT is aware.

## 2017-01-01 ENCOUNTER — Telehealth: Payer: Self-pay | Admitting: Gastroenterology

## 2017-01-01 NOTE — Telephone Encounter (Signed)
slf took patient off prednisone and his arthritis is bothering him   Was told he may try humira.  He has not has a rash but his arthritis is awful  [phone]

## 2017-01-01 NOTE — Telephone Encounter (Signed)
I called and he said he has been off of the prednisone for about a month. He is having a lot of arthritis pain ( he thinks that is what it is) in his back and both legs.  He has had 2 back surgeries previously, one for herniated disk and one for sciatic nerve problem.  He said Dr. Oneida Alar had discussed a new medication and he is not sure what it was. Please advise!

## 2017-01-02 NOTE — Telephone Encounter (Signed)
PLEASE CALL PT. If he is having trouble with is arthritis, he should see Dr. Teryl Lucy.

## 2017-01-05 NOTE — Telephone Encounter (Signed)
Pts wife is aware 

## 2017-01-22 ENCOUNTER — Telehealth: Payer: Self-pay

## 2017-01-22 NOTE — Telephone Encounter (Signed)
Pt called this morning to see what he could do. He has had diarrhea for about 4-5 wks (IBS). He has stopped the prednisone completely. He is taking imodium and some medication for the IBS, that is helping some. He has an appointment on on 02/13/17 @ 8:30 with LSL. Please advise

## 2017-01-22 NOTE — Telephone Encounter (Signed)
Patient has UC not IBS.  He will probably need Humira for his UC if he is in agreement. That can be addressed at [place].   Will you please find out if patient is seeing blood in stool, having abd pain, and how many stools daily.

## 2017-01-23 MED ORDER — PREDNISONE 10 MG PO TABS: ORAL_TABLET | ORAL | 0 refills | Status: DC

## 2017-01-23 NOTE — Telephone Encounter (Signed)
Pt is aware of everything. He said thank you for helping him. He will call Monday with a PR.

## 2017-01-23 NOTE — Telephone Encounter (Signed)
No blood in stools. Lower abd pain and he feels like he is having to go to the bathroom all the time. He is going 5-7 time a day. He said that it is acting like a flare up in his colon.

## 2017-01-23 NOTE — Telephone Encounter (Signed)
PLEASE LET PATIENT KNOW  Unfortunately, we will need to put him back on prednisone until we can see him in office and discuss Humira. I want to try low-dose.   Will start at prednisone 54m daily with taper to 546mdaily in 1 week until OV. RX sent. Call Monday if no better or go to ER if severe pain, unable to stay hydrated. Drink to keep urine light yellow.

## 2017-01-23 NOTE — Addendum Note (Signed)
Addended by: Mahala Menghini on: 01/23/2017 10:11 AM   Modules accepted: Orders

## 2017-02-04 ENCOUNTER — Other Ambulatory Visit: Payer: Self-pay | Admitting: Gastroenterology

## 2017-02-13 ENCOUNTER — Ambulatory Visit (INDEPENDENT_AMBULATORY_CARE_PROVIDER_SITE_OTHER): Payer: Medicare Other | Admitting: Gastroenterology

## 2017-02-13 ENCOUNTER — Encounter: Payer: Self-pay | Admitting: Gastroenterology

## 2017-02-13 VITALS — BP 119/72 | HR 61 | Temp 97.8°F | Ht 71.0 in | Wt 196.4 lb

## 2017-02-13 DIAGNOSIS — K5100 Ulcerative (chronic) pancolitis without complications: Secondary | ICD-10-CM | POA: Diagnosis not present

## 2017-02-13 NOTE — Patient Instructions (Addendum)
1. Continue prednisone 5 mg daily for now. I will hunt down your labs done in [place], review with Dr. fields and then we will let you know her recommendations regarding Humira.

## 2017-02-13 NOTE — Progress Notes (Signed)
Primary Care Physician: Andres Shad, MD  Primary Gastroenterologist:  Barney Drain, MD   Chief Complaint  Patient presents with  . Ulcerative Colitis    HPI: Louis Bowers is a 82 y.o. male here for follow up of UC. Last seen 11/13/16. On slow prednisone taper, 84m daily at that time. Doing well at that time. Declined humira at that time. On Delzicol 8067mtid. Renal insuffiiency stable and followed by nephrology. Started prednisone 53m653mod for 2 weeks and then stopped. Within one month his arthralgias returned. States he also has RA. Stools became More frequent and loose. Used to have 1-2 [place] fours per day, now [place] a couple per day. No bright red blood per rectum. No abdominal pain. Appetite normal. No upper GI symptoms.  Bone density study due next January.  Labs from November 2017 Hepatitis A total antibody positive, hepatitis B core IgM nonreactive, hepatitis B surface antigen nonreactive, hepatitis B surface antibody less than 1, TB gold negative.   Current Outpatient Prescriptions  Medication Sig Dispense Refill  . acetaminophen (TYLENOL) 500 MG tablet Take 1,000 mg by mouth as needed. Reported on 04/23/2016    . Ascorbic Acid (VITAMIN C WITH ROSE HIPS) 1000 MG tablet Take 1,200 mg by mouth daily.    . Calcium Carbonate-Vit D-Min (CALCIUM 1200 PO) Take 1 capsule by mouth daily. One daily    . cholecalciferol (VITAMIN D) 400 units TABS tablet Take 800 Units by mouth daily.     . DELZICOL 400 MG CPDR DR capsule TAKE TWO CAPSULES (800 MG TOTAL) BY MOUTH THREE TIMES DAILY 540 capsule 3  . diphenhydrAMINE (BENADRYL) 25 mg capsule Take 25 mg by mouth daily.     . EMarland KitchenIQUIS 5 MG TABS tablet Take 5 mg by mouth 2 (two) times daily.     . febuxostat (ULORIC) 40 MG tablet Take 40 mg by mouth daily.    . finasteride (PROSCAR) 5 MG tablet Take 5 mg by mouth daily.     . Glucosamine Sulfate 500 MG CAPS Take by mouth 2 (two) times daily.     . hydrochlorothiazide  (HYDRODIURIL) 25 MG tablet Take 25 mg by mouth daily.     . lMarland Kitchenvothyroxine (SYNTHROID, LEVOTHROID) 50 MCG tablet Take 50 mcg by mouth daily before breakfast.     . lipase/protease/amylase (CREON) 36000 UNITS CPEP capsule Take two with breakfast, two with lunch, two with supper. (Patient taking differently: 24,000 Units 3 (three) times daily with meals. Take two tablets three times a day) 600 capsule 3  . metoprolol (LOPRESSOR) 50 MG tablet Take 50 mg by mouth 3 (three) times daily.     . PMarland KitchenCERONE 100 MG tablet Take 100 mg by mouth daily.     . pantoprazole (PROTONIX) 40 MG tablet Take 40 mg by mouth daily.     . predniSONE (DELTASONE) 10 MG tablet Take 56m15mily with breakfast for 7 days, then 5mg 90mly with breakfast. 50 tablet 0  . tamsulosin (FLOMAX) 0.4 MG CAPS capsule Take 0.4 mg by mouth daily.      No current facility-administered medications for this visit.     Allergies as of 02/13/2017  . (No Known Allergies)    ROS:  General: Negative for anorexia, weight loss, fever, chills, fatigue, weakness. ENT: Negative for hoarseness, difficulty swallowing , nasal congestion. CV: Negative for chest pain, angina, palpitations, dyspnea on exertion, peripheral edema.  Respiratory: Negative for dyspnea at rest, dyspnea on exertion, cough, sputum,  wheezing.  GI: See history of present illness. GU:  Negative for dysuria, hematuria, urinary incontinence, urinary frequency, nocturnal urination.  Endo: Negative for unusual weight change.    Physical Examination:   BP 119/72   Pulse 61   Temp 97.8 F (36.6 C) (Oral)   Ht 5' 11"  (1.803 m)   Wt 196 lb 6.4 oz (89.1 kg)   BMI 27.39 kg/m   General: Well-nourished, well-developed in no acute distress.  Eyes: No icterus. Mouth: Oropharyngeal mucosa moist and pink , no lesions erythema or exudate. Lungs: Clear to auscultation bilaterally.  Heart: Regular rate and rhythm, no murmurs rubs or gallops.  Abdomen: Bowel sounds are normal,  nontender, nondistended, no hepatosplenomegaly or masses, no abdominal bruits or hernia , no rebound or guarding.   Extremities: No lower extremity edema. No clubbing or deformities. Neuro: Alert and oriented x 4   Skin: Warm and dry, no jaundice.   Psych: Alert and cooperative, normal mood and affect.

## 2017-02-15 NOTE — Assessment & Plan Note (Signed)
Slow prednisone taper, completely discontinued in January. Recurrent arthritic pain and increased frequency of stools/loose stool within 6 weeks off her prednisone. Patient was placed back on prednisone when he called in couple weeks ago. Symptoms resolved again. Feeling much better. Currently on prednisone 5 mg daily. Discussed at length problems with chronic prednisone therapy. Patient's ultimate goal is to preserve his health and have another good 10 years of life. If this can be done with low-dose prednisone he is fine with that option but if we recommend Humira he would consider that as well. We were able to obtain labs that he had done at an outside facility in November with negative TB gold test, negative hepatitis B surface antigen. Will discuss further recommendations with Dr. Oneida Alar. Patient does have a history of remote bladder malignancy which is not a complete contraindication to use of Humira but caution advised. Further recommendations to follow.

## 2017-03-19 ENCOUNTER — Telehealth: Payer: Self-pay

## 2017-03-19 NOTE — Telephone Encounter (Signed)
Pt called to say he called the lab in [place] and told them to fax the lab reports to [place] and gave them our fax number. He left me their phone number to call if we do not receive them. [phone]).

## 2017-03-19 NOTE — Progress Notes (Signed)
REVIEWED. PT AWARE CHRONIC STEROIDS ARE NOT STANDARD OF CARE FOR [place]. PT SHOULD PROCEED WITH HUMIRA FOR CHRONIC DISEASE MANAGEMENT.

## 2017-03-23 ENCOUNTER — Telehealth: Payer: Self-pay

## 2017-03-23 NOTE — Telephone Encounter (Signed)
LMOM to call.

## 2017-03-23 NOTE — Telephone Encounter (Signed)
Patient called to ask if we have gotten the lab results from [place] yet and had a few question regarding an upcoming surgery he is having and medication he is on from dr. Oneida Alar

## 2017-03-23 NOTE — Telephone Encounter (Signed)
PT will be having a knee replacement in 4-5 weeks. I have just received labs that were done at end of Nov 2017. Placing on Neil Crouch, PA's box for review.

## 2017-03-24 ENCOUNTER — Other Ambulatory Visit: Payer: Self-pay

## 2017-03-24 DIAGNOSIS — K5180 Other ulcerative colitis without complications: Secondary | ICD-10-CM

## 2017-03-24 NOTE — Telephone Encounter (Signed)
Called and pt is unavailable now.

## 2017-03-24 NOTE — Telephone Encounter (Signed)
Please let patient know that his labs were reviewed. His hepatitis B core IgM, hepatitis B surface antigen, hepatitis B surface antibody all unremarkable, nonreactive. TB goal was negative.  I need one more lab, Hep B core TOTAL ab ASAP. He can have done at his local [facility].  Please let patient know that Dr. Oneida Alar recommends him transition to Humira with goal of getting off prednisone.   If he is okay with this we should start process to get Humira approved.

## 2017-03-25 NOTE — Telephone Encounter (Signed)
Labs were received and Neil Crouch, PA has reviewed. See separate note.

## 2017-03-25 NOTE — Telephone Encounter (Signed)
Pt was not at home, I spoke with his wife, Patsy. She is aware for pt to go to the lab and I am faxing the orders over now. Fax # [phone]. ( She is also aware of the results and plan).  Phone number to lab if needed: [phone].

## 2017-03-27 NOTE — Telephone Encounter (Addendum)
Hepatitis B core antibody nonreactive. Please proceed with Humira approval process if patient is okay with this.

## 2017-03-31 NOTE — Telephone Encounter (Signed)
PT's wife, Tessie Fass, is aware and she will tell him and he will let us know.

## 2017-04-01 NOTE — Telephone Encounter (Signed)
Please start paperwork for Humira.

## 2017-04-01 NOTE — Telephone Encounter (Signed)
Pt is aware and is ready to get the process started.

## 2017-04-03 NOTE — Telephone Encounter (Signed)
Paperwork on [employer].

## 2017-04-03 NOTE — Telephone Encounter (Signed)
To Neil Crouch, PA.

## 2017-04-03 NOTE — Telephone Encounter (Addendum)
I have completed paperwork for Humira starter kit.   Make sure he is enrolled in the ambassador program.  Do I also need to check box for humira maintenance now as well?   1# Patient will need ov with slf 2 months after starting Humira.  2# Once humira started, he will need to taper off prednisone, would start by decreasing by 16m daily every 10 days until off. Let me know if he needs RX for 131mtablets.

## 2017-04-07 NOTE — Telephone Encounter (Signed)
PT is aware of the plan also and will fill out the form and return. He will need prescription for Prednisone 1 mg tablets. He is taking 5 mg now ( 1/2 of 10 mg tablet). For clarification, when he starts the Humira he will wait 10 days to decrease to 4 mg?  Please advise!

## 2017-04-07 NOTE — Telephone Encounter (Signed)
Pt's wife is aware of the plan. I am mailing the info for the [facility] and he will fill out and return to [place]. Wife is not sure about the meds he has on hand and she will have pt call me to see if he needs prescription.

## 2017-04-08 MED ORDER — PREDNISONE 1 MG PO TABS: ORAL_TABLET | ORAL | 0 refills | Status: DC

## 2017-04-08 NOTE — Telephone Encounter (Signed)
He can immediately decrease prednisone by 77m the first day of Humira.  RX for 131mtablets of prednisone sent to pharmacy.

## 2017-04-08 NOTE — Telephone Encounter (Signed)
Pt is aware and I am mailing him the info ( printing this out) for decreasing the prednisone. He is aware RX sent in for the 1 mg tablets. I have faxed the order to [place].

## 2017-04-08 NOTE — Addendum Note (Signed)
Addended by: Mahala Menghini on: 04/08/2017 09:58 AM   Modules accepted: Orders

## 2017-04-10 ENCOUNTER — Other Ambulatory Visit: Payer: Self-pay | Admitting: Orthopedic Surgery

## 2017-04-14 ENCOUNTER — Encounter: Payer: Self-pay | Admitting: Gastroenterology

## 2017-04-16 NOTE — Telephone Encounter (Signed)
I faxed the Ambassador Enrollment form to [phone].

## 2017-04-27 ENCOUNTER — Telehealth: Payer: Self-pay | Admitting: Gastroenterology

## 2017-04-27 NOTE — Telephone Encounter (Signed)
Pt wanted to let us know that he has received paperwork for application for assistance for the Humira. He has been discussing with the Lawyer.  He said she said he will be able to come off of the Delzicol and prednisone when he goes on the Humira. He said no way he can afford humira if he does not get assistance, it would costs him over $16,000.00.  Fyi: he goes for partial knee replacement on his left knee on 05/04/2017. Michela Pitcher he will be in touch to let us know how things move along.

## 2017-04-27 NOTE — Telephone Encounter (Signed)
[phone]  PLEASE CALL PATIENT ABOUT HUMERA, WANTS TO KNOW WHAT THAT IS TAKING THE PLACE OF

## 2017-04-28 ENCOUNTER — Encounter (HOSPITAL_COMMUNITY): Payer: Self-pay

## 2017-04-28 ENCOUNTER — Encounter (HOSPITAL_COMMUNITY)
Admission: RE | Admit: 2017-04-28 | Discharge: 2017-04-28 | Disposition: A | Discharge status: Home / Self Care | Payer: Medicare Other | Source: Ambulatory Visit | Attending: Orthopedic Surgery | Admitting: Orthopedic Surgery

## 2017-04-28 DIAGNOSIS — I129 Hypertensive chronic kidney disease with stage 1 through stage 4 chronic kidney disease, or unspecified chronic kidney disease: Secondary | ICD-10-CM | POA: Insufficient documentation

## 2017-04-28 DIAGNOSIS — E039 Hypothyroidism, unspecified: Secondary | ICD-10-CM | POA: Insufficient documentation

## 2017-04-28 DIAGNOSIS — Z8551 Personal history of malignant neoplasm of bladder: Secondary | ICD-10-CM | POA: Diagnosis not present

## 2017-04-28 DIAGNOSIS — N183 Chronic kidney disease, stage 3 (moderate): Secondary | ICD-10-CM | POA: Insufficient documentation

## 2017-04-28 DIAGNOSIS — Z01818 Encounter for other preprocedural examination: Secondary | ICD-10-CM | POA: Insufficient documentation

## 2017-04-28 DIAGNOSIS — Z7902 Long term (current) use of antithrombotics/antiplatelets: Secondary | ICD-10-CM | POA: Diagnosis not present

## 2017-04-28 HISTORY — DX: Essential (primary) hypertension: I10

## 2017-04-28 HISTORY — DX: Hypothyroidism, unspecified: E03.9

## 2017-04-28 LAB — SURGICAL PCR SCREEN
MRSA, PCR: NEGATIVE
Staphylococcus aureus: NEGATIVE

## 2017-04-28 LAB — COMPREHENSIVE METABOLIC PANEL
ALBUMIN: 3.8 g/dL (ref 3.5–5.0)
ALT: 15 U/L — ABNORMAL LOW (ref 17–63)
ANION GAP: 8 (ref 5–15)
AST: 22 U/L (ref 15–41)
Alkaline Phosphatase: 61 U/L (ref 38–126)
BUN: 29 mg/dL — AB (ref 6–20)
CHLORIDE: 105 mmol/L (ref 101–111)
CO2: 25 mmol/L (ref 22–32)
Calcium: 9 mg/dL (ref 8.9–10.3)
Creatinine, Ser: 1.31 mg/dL — ABNORMAL HIGH (ref 0.61–1.24)
GFR calc Af Amer: 58 mL/min — ABNORMAL LOW (ref >60)
GFR calc non Af Amer: 50 mL/min — ABNORMAL LOW (ref >60)
GLUCOSE: 113 mg/dL — AB (ref 65–99)
POTASSIUM: 4 mmol/L (ref 3.5–5.1)
SODIUM: 138 mmol/L (ref 135–145)
Total Bilirubin: 0.2 mg/dL — ABNORMAL LOW (ref 0.3–1.2)
Total Protein: 6.9 g/dL (ref 6.5–8.1)

## 2017-04-28 LAB — CBC WITH DIFFERENTIAL/PLATELET
BASOS ABS: 0 10*3/uL (ref 0.0–0.1)
BASOS PCT: 0 %
EOS ABS: 0 10*3/uL (ref 0.0–0.7)
EOS PCT: 0 %
HCT: 38.1 % — ABNORMAL LOW (ref 39.0–52.0)
Hemoglobin: 11.8 g/dL — ABNORMAL LOW (ref 13.0–17.0)
Lymphocytes Relative: 17 %
Lymphs Abs: 1.4 10*3/uL (ref 0.7–4.0)
MCH: 28.2 pg (ref 26.0–34.0)
MCHC: 31 g/dL (ref 30.0–36.0)
MCV: 91.1 fL (ref 78.0–100.0)
MONO ABS: 0.6 10*3/uL (ref 0.1–1.0)
Monocytes Relative: 7 %
NEUTROS ABS: 6.3 10*3/uL (ref 1.7–7.7)
Neutrophils Relative %: 76 %
PLATELETS: 207 10*3/uL (ref 150–400)
RBC: 4.18 MIL/uL — ABNORMAL LOW (ref 4.22–5.81)
RDW: 14.4 % (ref 11.5–15.5)
WBC: 8.3 10*3/uL (ref 4.0–10.5)

## 2017-04-28 NOTE — Progress Notes (Signed)
PCP - Ramond Dial Cardiologist - Dr. Gloriann Loan  Chest x-ray - not needed EKG - requesting Stress Test - requesting ECHO - requesting Cardiac Cath - requesting  Will send to anesthesia for review of heart history   Patient was instructed by Dr. Ronnie Derby to stop Eliquis one week prior last dose was 04/26/17    Patient denies shortness of breath, fever, cough and chest pain at PAT appointment   Patient verbalized understanding of instructions that were given to them at the PAT appointment. Patient was also instructed that they will need to review over the PAT instructions again at home before surgery.

## 2017-04-28 NOTE — Pre-Procedure Instructions (Signed)
Louis Carne Tufaro Jr.  04/28/2017      [place], [place] [address] Phone: [phone] Fax: [phone]    Your procedure is scheduled on June 25  Report to [facility] Admitting at 0800 A.M.  Call this number if you have problems the morning of surgery:  [phone]   Remember:  Do not eat food or drink liquids after midnight.   Take these medicines the morning of surgery with A SIP OF WATER acetaminophen (TYLENOL)if needed, febuxostat (ULORIC),  finasteride (PROSCAR), levothyroxine (SYNTHROID, LEVOTHROID), metoprolol (LOPRESSOR), PACERONE, pantoprazole (PROTONIX), predniSONE (DELTASONE) if currently taking, tamsulosin (FLOMAX)   7 days prior to surgery STOP taking any DELZICOL, Aspirin, Aleve, Naproxen, Ibuprofen, Motrin, Advil, Goody's, BC's, all herbal medications, fish oil, and all vitamins  FOLLOW PHYSICIANS INSTRUCTIONS ABOUT ELIQUIS   Do not wear jewelry  Do not wear lotions, powders, or cologne, or deoderant.  Men may shave face and neck.  Do not bring valuables to the hospital.  [facility] is not responsible for any belongings or valuables.  Contacts, dentures or bridgework may not be worn into surgery.  Leave your suitcase in the car.  After surgery it may be brought to your room.  For patients admitted to the hospital, discharge time will be determined by your treatment team.  Patients discharged the day of surgery will not be allowed to drive home.   Special instructions:   [place]- Preparing For Surgery  Before surgery, you can play an important role. Because skin is not sterile, your skin needs to be as free of germs as possible. You can reduce the number of germs on your skin by washing with CHG (chlorahexidine gluconate) Soap before surgery.  CHG is an antiseptic cleaner which kills germs and bonds with the skin to continue killing germs even after washing.  Please do not  use if you have an allergy to CHG or antibacterial soaps. If your skin becomes reddened/irritated stop using the CHG.  Do not shave (including legs and underarms) for at least 48 hours prior to first CHG shower. It is OK to shave your face.  Please follow these instructions carefully.   1. Shower the NIGHT BEFORE SURGERY and the MORNING OF SURGERY with CHG.   2. If you chose to wash your hair, wash your hair first as usual with your normal shampoo.  3. After you shampoo, rinse your hair and body thoroughly to remove the shampoo.  4. Use CHG as you would any other liquid soap. You can apply CHG directly to the skin and wash gently with a scrungie or a clean washcloth.   5. Apply the CHG Soap to your body ONLY FROM THE NECK DOWN.  Do not use on open wounds or open sores. Avoid contact with your eyes, ears, mouth and genitals (private parts). Wash genitals (private parts) with your normal soap.  6. Wash thoroughly, paying special attention to the area where your surgery will be performed.  7. Thoroughly rinse your body with warm water from the neck down.  8. DO NOT shower/wash with your normal soap after using and rinsing off the CHG Soap.  9. Pat yourself dry with a CLEAN TOWEL.   10. Wear CLEAN PAJAMAS   11. Place CLEAN SHEETS on your bed the night of your first shower and DO NOT SLEEP WITH PETS.    Day of Surgery: Do not apply any  deodorants/lotions. Please wear clean clothes to the hospital/surgery center.      Please read over the following fact sheets that you were given.

## 2017-04-29 NOTE — Progress Notes (Signed)
Anesthesia Chart Review:  Pt is an 82 year old male scheduled for L unicompartmental knee on 05/04/2017 with Vickey Huger, M.D.  - PCP is Andres Shad, MD - Cardiologist is Delanna Notice, MD who cleared pt for surgery at last office visit 03/31/17  PMH includes: Atrial fibrillation, HTN, CKD (stage III), ulcerative colitis, hypothyroidism, pancreatitis, bladder cancer. Never smoker. BMI 28.  Medications include: Mesalamine, Eliquis, HCTZ, levothyroxine, metoprolol, Pacerone, Protonix, prednisone. Pt reported at PAT he stopped his eliquis 04/26/17.  I spoke with Lattie Haw at Dr. Ruel Favors office; she will have pt restart eliquis and hold 3 days before surgery (pt is posted for spinal anesthesia)  Preoperative labs reviewed. PT will be obtained DOS  EKG 03/31/17: Sinus bradycardia (56 bpm). Anteroseptal infarct, age undetermined  Cardiac cath 12/19/11 [facility]): Minor epicardial coronary disease (LAD 10%). Normal LV systolic function.  If no changes, I anticipate pt can proceed with surgery as scheduled.   Willeen Cass, FNP-BC [facility] Short Stay Surgical Center/Anesthesiology Phone: [phone] 04/29/2017 1:37 PM

## 2017-04-30 NOTE — Telephone Encounter (Addendum)
Good luck on surgery. Let me know what he finds out about Humira assistance. Yes, if Humira is started, he would come off delzicol and prednisone.  HIS NEXT OV NEEDS TO BE WITH [facility]. PLEASE CANCEL MY OV IN 06/2017 AND CHANGE IN [facility].

## 2017-05-01 ENCOUNTER — Encounter: Payer: Self-pay | Admitting: Gastroenterology

## 2017-05-01 MED ORDER — CEFAZOLIN SODIUM-DEXTROSE 2-4 GM/100ML-% IV SOLN
2.0000 g | INTRAVENOUS | Status: AC
  Administered 2017-05-04: 2 g via INTRAVENOUS
  Filled 2017-05-01: qty 100

## 2017-05-01 MED ORDER — DEXAMETHASONE SODIUM PHOSPHATE 10 MG/ML IJ SOLN
8.0000 mg | Freq: Once | INTRAMUSCULAR | Status: AC
  Administered 2017-05-04: 10 mg via INTRAVENOUS
  Filled 2017-05-01: qty 1

## 2017-05-01 MED ORDER — GABAPENTIN 300 MG PO CAPS
300.0000 mg | ORAL_CAPSULE | Freq: Once | ORAL | Status: AC
  Administered 2017-05-04: 300 mg via ORAL
  Filled 2017-05-01: qty 1

## 2017-05-01 MED ORDER — BUPIVACAINE LIPOSOME 1.3 % IJ SUSP
20.0000 mL | INTRAMUSCULAR | Status: AC
  Administered 2017-05-04: 20 mL
  Filled 2017-05-01: qty 20

## 2017-05-01 MED ORDER — ACETAMINOPHEN 500 MG PO TABS
1000.0000 mg | ORAL_TABLET | Freq: Once | ORAL | Status: DC
  Filled 2017-05-01: qty 2

## 2017-05-01 MED ORDER — TRANEXAMIC ACID 1000 MG/10ML IV SOLN
1000.0000 mg | INTRAVENOUS | Status: AC
  Administered 2017-05-04: 1000 mg via INTRAVENOUS
  Filled 2017-05-01: qty 10

## 2017-05-01 NOTE — Telephone Encounter (Signed)
R/S TO APPOINTMENT WITH [facility]

## 2017-05-03 NOTE — H&P (Signed)
Louis Bowers. MRN:  [id number] DOB/SEX:  02-18-1935/male  CHIEF COMPLAINT:  Painful left Knee  HISTORY: Patient is a 82 y.o. male presented with a history of pain in the left knee. Onset of symptoms was gradual starting a few years ago with gradually worsening course since that time. Patient has been treated conservatively with over-the-counter NSAIDs and activity modification. Patient currently rates pain in the knee at 10 out of 10 with activity. There is pain at night.  PAST MEDICAL HISTORY: Patient Active Problem List   Diagnosis Date Noted  . Colon cancer screening 04/23/2016  . Ulcerative colitis ([place]) 10/25/2014  . Chronic pancreatitis ([place]) 10/25/2014   Past Medical History:  Diagnosis Date  . A-fib ([place])    Dr. Rosalita Chessman  . Bladder cancer ([place]) 2004   instilled chemo  . Chronic renal insufficiency, stage III (moderate)    [facility]  . Hypertension   . Hypothyroidism   . Osteoarthritis   . Pancreas divisum, native   . Pancreatitis    Dr.  Oliva Bustard. Shifflett  . UC (ulcerative colitis) ([place])    Dr. Spainhour/Dr. Shifflett: diagnosed 11/2011 involved entire colon   Past Surgical History:  Procedure Laterality Date  . BACK SURGERY  05/2003  . BACK SURGERY  06/2014  . BACK SURGERY  08/2015  . BLADDER SURGERY  02/2009  . CARDIAC CATHETERIZATION    . CARDIOVASCULAR STRESS TEST    . COLONOSCOPY N/A 2005 [place]   Dr. Nathaneil Canary Shiflett: 5 polyps removed from cecal area all were benign  . COLONOSCOPY  05/2011   Dr. Nathaneil Canary Shiflett:no polyps, diverticulosis  . ESOPHAGOGASTRODUODENOSCOPY  2005 [place]   Dr. Nathaneil Canary Shiflett: normal except for antral gastritis  . EUS  09/2011   [facility]: chronic pancreatitis vs chronic smodering pancreatitis  . FLEXIBLE SIGMOIDOSCOPY  02/2012   Dr. Nathaneil Canary Shiflett: advance up to 60cm, entire left colon with intense UC flare, no bx as patient was on coumdin  . FLEXIBLE SIGMOIDOSCOPY  11/2011   Dr. Nathaneil Canary Shiflett:  diffuse colitis universal, cecal polyp removed (adenoma)  . REPLACEMENT TOTAL KNEE Right   . US ECHOCARDIOGRAPHY       MEDICATIONS:   No prescriptions prior to admission.    ALLERGIES:   Allergies  Allergen Reactions  . No Known Allergies     REVIEW OF SYSTEMS:  A comprehensive review of systems was negative except for: Musculoskeletal: positive for arthralgias and bone pain   FAMILY HISTORY:   Family History  Problem Relation Age of Onset  . Liver disease Other        uncles: etoh  . Colon cancer Neg Hx   . Ulcerative colitis Neg Hx     SOCIAL HISTORY:   Social History  Substance Use Topics  . Smoking status: Never Smoker  . Smokeless tobacco: Never Used  . Alcohol use No     EXAMINATION:  Vital signs in last 24 hours:    There were no vitals taken for this visit.  General Appearance:    Alert, cooperative, no distress, appears stated age  Head:    Normocephalic, without obvious abnormality, atraumatic  Eyes:    PERRL, conjunctiva/corneas clear, EOM's intact, fundi    benign, both eyes       Ears:    Normal TM's and external ear canals, both ears  Nose:   Nares normal, septum midline, mucosa normal, no drainage    or sinus tenderness  Throat:   Lips, mucosa, and tongue normal;  teeth and gums normal  Neck:   Supple, symmetrical, trachea midline, no adenopathy;       thyroid:  No enlargement/tenderness/nodules; no carotid   bruit or JVD  Back:     Symmetric, no curvature, ROM normal, no CVA tenderness  Lungs:     Clear to auscultation bilaterally, respirations unlabored  Chest wall:    No tenderness or deformity  Heart:    Regular rate and rhythm, S1 and S2 normal, no murmur, rub   or gallop  Abdomen:     Soft, non-tender, bowel sounds active all four quadrants,    no masses, no organomegaly  Genitalia:    Normal male without lesion, discharge or tenderness  Rectal:    Normal tone, normal prostate, no masses or tenderness;   guaiac negative stool   Extremities:   Extremities normal, atraumatic, no cyanosis or edema  Pulses:   2+ and symmetric all extremities  Skin:   Skin color, texture, turgor normal, no rashes or lesions  Lymph nodes:   Cervical, supraclavicular, and axillary nodes normal  Neurologic:   CNII-XII intact. Normal strength, sensation and reflexes      throughout    Musculoskeletal:  ROM 0-120, Ligaments intact,  Imaging Review Plain radiographs demonstrate severe degenerative joint disease of the left knee medial compartment. The overall alignment is mild varus. The bone quality appears to be excellent for age and reported activity level.  Assessment/Plan: Primary osteoarthritis, left knee medial compartment  The patient history, physical examination and imaging studies are consistent with advanced degenerative joint disease of the left knee medial compartment. The patient has failed conservative treatment.  The clearance notes were reviewed.  After discussion with the patient it was felt that partial Knee Replacement was indicated. The procedure,  risks, and benefits of partial knee arthroplasty were presented and reviewed. The risks including but not limited to aseptic loosening, infection, blood clots, vascular injury, stiffness, patella tracking problems complications among others were discussed. The patient acknowledged the explanation, agreed to proceed with the plan.  Donia Ast 05/03/2017, 9:39 PM

## 2017-05-04 ENCOUNTER — Observation Stay (HOSPITAL_COMMUNITY)
Admission: RE | Admit: 2017-05-04 | Discharge: 2017-05-05 | Disposition: A | Discharge status: Home / Self Care | Payer: Medicare Other | Source: Ambulatory Visit | Attending: Orthopedic Surgery | Admitting: Orthopedic Surgery

## 2017-05-04 ENCOUNTER — Encounter (HOSPITAL_COMMUNITY): Payer: Self-pay | Admitting: Urology

## 2017-05-04 ENCOUNTER — Encounter (HOSPITAL_COMMUNITY)
Admission: RE | Disposition: A | Discharge status: Home / Self Care | Payer: Self-pay | Source: Ambulatory Visit | Attending: Orthopedic Surgery

## 2017-05-04 ENCOUNTER — Ambulatory Visit (HOSPITAL_COMMUNITY): Payer: Medicare Other | Admitting: Emergency Medicine

## 2017-05-04 ENCOUNTER — Ambulatory Visit (HOSPITAL_COMMUNITY): Payer: Medicare Other | Admitting: Anesthesiology

## 2017-05-04 DIAGNOSIS — Z8551 Personal history of malignant neoplasm of bladder: Secondary | ICD-10-CM | POA: Insufficient documentation

## 2017-05-04 DIAGNOSIS — M1712 Unilateral primary osteoarthritis, left knee: Secondary | ICD-10-CM | POA: Diagnosis not present

## 2017-05-04 DIAGNOSIS — Z96652 Presence of left artificial knee joint: Secondary | ICD-10-CM

## 2017-05-04 DIAGNOSIS — I129 Hypertensive chronic kidney disease with stage 1 through stage 4 chronic kidney disease, or unspecified chronic kidney disease: Secondary | ICD-10-CM | POA: Insufficient documentation

## 2017-05-04 DIAGNOSIS — K219 Gastro-esophageal reflux disease without esophagitis: Secondary | ICD-10-CM | POA: Insufficient documentation

## 2017-05-04 DIAGNOSIS — I4891 Unspecified atrial fibrillation: Secondary | ICD-10-CM | POA: Diagnosis not present

## 2017-05-04 DIAGNOSIS — E039 Hypothyroidism, unspecified: Secondary | ICD-10-CM | POA: Diagnosis not present

## 2017-05-04 DIAGNOSIS — K859 Acute pancreatitis without necrosis or infection, unspecified: Secondary | ICD-10-CM | POA: Insufficient documentation

## 2017-05-04 DIAGNOSIS — K861 Other chronic pancreatitis: Secondary | ICD-10-CM | POA: Diagnosis not present

## 2017-05-04 DIAGNOSIS — N183 Chronic kidney disease, stage 3 (moderate): Secondary | ICD-10-CM | POA: Diagnosis not present

## 2017-05-04 DIAGNOSIS — Z7902 Long term (current) use of antithrombotics/antiplatelets: Secondary | ICD-10-CM | POA: Insufficient documentation

## 2017-05-04 DIAGNOSIS — M1990 Unspecified osteoarthritis, unspecified site: Secondary | ICD-10-CM | POA: Insufficient documentation

## 2017-05-04 DIAGNOSIS — Z79899 Other long term (current) drug therapy: Secondary | ICD-10-CM | POA: Diagnosis not present

## 2017-05-04 HISTORY — PX: PARTIAL KNEE ARTHROPLASTY: SHX2174

## 2017-05-04 HISTORY — PX: KNEE SURGERY: SHX244

## 2017-05-04 LAB — PROTIME-INR
INR: 0.93
Prothrombin Time: 12.5 seconds (ref 11.4–15.2)

## 2017-05-04 SURGERY — ARTHROPLASTY, KNEE, UNICOMPARTMENTAL
Anesthesia: Regional | Laterality: Left

## 2017-05-04 MED ORDER — EPHEDRINE SULFATE 50 MG/ML IJ SOLN
INTRAMUSCULAR | Status: DC | PRN
  Administered 2017-05-04 (×5): 10 mg via INTRAVENOUS

## 2017-05-04 MED ORDER — FENTANYL CITRATE (PF) 100 MCG/2ML IJ SOLN
INTRAMUSCULAR | Status: AC
  Administered 2017-05-04: 50 ug via INTRAVENOUS
  Filled 2017-05-04: qty 2

## 2017-05-04 MED ORDER — ONDANSETRON HCL 4 MG/2ML IJ SOLN: 4.0000 mg | Freq: Four times a day (QID) | INTRAMUSCULAR | Status: DC | PRN

## 2017-05-04 MED ORDER — BISACODYL 5 MG PO TBEC: 5.0000 mg | DELAYED_RELEASE_TABLET | Freq: Every day | ORAL | Status: DC | PRN

## 2017-05-04 MED ORDER — HYDROCODONE-ACETAMINOPHEN 7.5-325 MG PO TABS
1.0000 | ORAL_TABLET | Freq: Four times a day (QID) | ORAL | Status: DC
  Administered 2017-05-04 – 2017-05-05 (×4): 1 via ORAL
  Filled 2017-05-04 (×5): qty 1

## 2017-05-04 MED ORDER — FLEET ENEMA 7-19 GM/118ML RE ENEM: 1.0000 | ENEMA | Freq: Once | RECTAL | Status: DC | PRN

## 2017-05-04 MED ORDER — DIPHENHYDRAMINE HCL 12.5 MG/5ML PO ELIX
12.5000 mg | ORAL_SOLUTION | ORAL | Status: DC | PRN
Start: 2017-05-04 — End: 2017-05-05

## 2017-05-04 MED ORDER — AMIODARONE HCL 100 MG PO TABS
100.0000 mg | ORAL_TABLET | Freq: Every day | ORAL | Status: DC
  Administered 2017-05-05: 100 mg via ORAL
  Filled 2017-05-04: qty 1

## 2017-05-04 MED ORDER — OXYCODONE HCL 5 MG PO TABS: 5.0000 mg | ORAL_TABLET | Freq: Once | ORAL | Status: DC | PRN

## 2017-05-04 MED ORDER — LEVOTHYROXINE SODIUM 50 MCG PO TABS
50.0000 ug | ORAL_TABLET | Freq: Every day | ORAL | Status: DC
  Administered 2017-05-05: 50 ug via ORAL
  Filled 2017-05-04: qty 1

## 2017-05-04 MED ORDER — MESALAMINE 400 MG PO CPDR
800.0000 mg | DELAYED_RELEASE_CAPSULE | Freq: Three times a day (TID) | ORAL | Status: DC
  Administered 2017-05-04 – 2017-05-05 (×3): 800 mg via ORAL
  Filled 2017-05-04 (×5): qty 2

## 2017-05-04 MED ORDER — MENTHOL 3 MG MT LOZG: 1.0000 | LOZENGE | OROMUCOSAL | Status: DC | PRN

## 2017-05-04 MED ORDER — APIXABAN 2.5 MG PO TABS
2.5000 mg | ORAL_TABLET | Freq: Two times a day (BID) | ORAL | Status: DC
  Administered 2017-05-05: 2.5 mg via ORAL
  Filled 2017-05-04: qty 1

## 2017-05-04 MED ORDER — DOCUSATE SODIUM 100 MG PO CAPS
100.0000 mg | ORAL_CAPSULE | Freq: Two times a day (BID) | ORAL | Status: DC
  Administered 2017-05-04 – 2017-05-05 (×2): 100 mg via ORAL
  Filled 2017-05-04 (×2): qty 1

## 2017-05-04 MED ORDER — HYDROCHLOROTHIAZIDE 25 MG PO TABS
25.0000 mg | ORAL_TABLET | Freq: Every day | ORAL | Status: DC
  Administered 2017-05-05: 25 mg via ORAL
  Filled 2017-05-04: qty 1

## 2017-05-04 MED ORDER — MIDAZOLAM HCL 2 MG/2ML IJ SOLN
INTRAMUSCULAR | Status: AC
  Filled 2017-05-04: qty 2

## 2017-05-04 MED ORDER — BUPIVACAINE-EPINEPHRINE (PF) 0.5% -1:200000 IJ SOLN
INTRAMUSCULAR | Status: DC | PRN
  Administered 2017-05-04: 20 mL via PERINEURAL

## 2017-05-04 MED ORDER — ACETAMINOPHEN 325 MG PO TABS: 650.0000 mg | ORAL_TABLET | Freq: Four times a day (QID) | ORAL | Status: DC | PRN

## 2017-05-04 MED ORDER — OXYCODONE HCL 5 MG PO TABS: 5.0000 mg | ORAL_TABLET | ORAL | Status: DC | PRN

## 2017-05-04 MED ORDER — FENTANYL CITRATE (PF) 100 MCG/2ML IJ SOLN: 25.0000 ug | INTRAMUSCULAR | Status: DC | PRN

## 2017-05-04 MED ORDER — ZOLPIDEM TARTRATE 5 MG PO TABS: 5.0000 mg | ORAL_TABLET | Freq: Every evening | ORAL | Status: DC | PRN

## 2017-05-04 MED ORDER — PROPOFOL 10 MG/ML IV BOLUS
INTRAVENOUS | Status: DC | PRN
  Administered 2017-05-04: 150 mg via INTRAVENOUS

## 2017-05-04 MED ORDER — MIDAZOLAM HCL 2 MG/2ML IJ SOLN
2.0000 mg | Freq: Once | INTRAMUSCULAR | Status: AC
  Administered 2017-05-04: 2 mg via INTRAVENOUS

## 2017-05-04 MED ORDER — OXYCODONE HCL 5 MG/5ML PO SOLN: 5.0000 mg | Freq: Once | ORAL | Status: DC | PRN

## 2017-05-04 MED ORDER — ACETAMINOPHEN 500 MG PO TABS
1000.0000 mg | ORAL_TABLET | Freq: Four times a day (QID) | ORAL | Status: AC
  Administered 2017-05-04 – 2017-05-05 (×4): 1000 mg via ORAL
  Filled 2017-05-04 (×5): qty 2

## 2017-05-04 MED ORDER — PHENOL 1.4 % MT LIQD: 1.0000 | OROMUCOSAL | Status: DC | PRN

## 2017-05-04 MED ORDER — SODIUM CHLORIDE 0.9 % IV SOLN
1000.0000 mg | Freq: Once | INTRAVENOUS | Status: AC
  Administered 2017-05-04: 1000 mg via INTRAVENOUS
  Filled 2017-05-04: qty 10

## 2017-05-04 MED ORDER — METHOCARBAMOL 500 MG PO TABS: 500.0000 mg | ORAL_TABLET | Freq: Four times a day (QID) | ORAL | Status: DC | PRN

## 2017-05-04 MED ORDER — PANTOPRAZOLE SODIUM 40 MG PO TBEC
40.0000 mg | DELAYED_RELEASE_TABLET | Freq: Every day | ORAL | Status: DC
  Administered 2017-05-05: 40 mg via ORAL
  Filled 2017-05-04: qty 1

## 2017-05-04 MED ORDER — 0.9 % SODIUM CHLORIDE (POUR BTL) OPTIME
TOPICAL | Status: DC | PRN
  Administered 2017-05-04: 1000 mL

## 2017-05-04 MED ORDER — DEXAMETHASONE SODIUM PHOSPHATE 10 MG/ML IJ SOLN
10.0000 mg | Freq: Once | INTRAMUSCULAR | Status: AC
  Administered 2017-05-05: 10 mg via INTRAVENOUS
  Filled 2017-05-04: qty 1

## 2017-05-04 MED ORDER — FINASTERIDE 5 MG PO TABS
5.0000 mg | ORAL_TABLET | Freq: Every day | ORAL | Status: DC
  Administered 2017-05-05: 5 mg via ORAL
  Filled 2017-05-04: qty 1

## 2017-05-04 MED ORDER — ONDANSETRON HCL 4 MG PO TABS: 4.0000 mg | ORAL_TABLET | Freq: Four times a day (QID) | ORAL | Status: DC | PRN

## 2017-05-04 MED ORDER — ALUM & MAG HYDROXIDE-SIMETH 200-200-20 MG/5ML PO SUSP: 30.0000 mL | ORAL | Status: DC | PRN

## 2017-05-04 MED ORDER — METOPROLOL TARTRATE 50 MG PO TABS
50.0000 mg | ORAL_TABLET | Freq: Three times a day (TID) | ORAL | Status: DC
  Administered 2017-05-04 – 2017-05-05 (×3): 50 mg via ORAL
  Filled 2017-05-04 (×3): qty 1

## 2017-05-04 MED ORDER — HYDROMORPHONE HCL 1 MG/ML IJ SOLN: 1.0000 mg | INTRAMUSCULAR | Status: DC | PRN

## 2017-05-04 MED ORDER — GABAPENTIN 300 MG PO CAPS
300.0000 mg | ORAL_CAPSULE | Freq: Three times a day (TID) | ORAL | Status: DC
  Administered 2017-05-04 – 2017-05-05 (×3): 300 mg via ORAL
  Filled 2017-05-04 (×3): qty 1

## 2017-05-04 MED ORDER — MIDAZOLAM HCL 2 MG/2ML IJ SOLN
INTRAMUSCULAR | Status: AC
  Administered 2017-05-04: 2 mg via INTRAVENOUS
  Filled 2017-05-04: qty 2

## 2017-05-04 MED ORDER — METOCLOPRAMIDE HCL 5 MG/ML IJ SOLN
5.0000 mg | Freq: Three times a day (TID) | INTRAMUSCULAR | Status: DC | PRN
Start: 2017-05-04 — End: 2017-05-05

## 2017-05-04 MED ORDER — FEBUXOSTAT 40 MG PO TABS
40.0000 mg | ORAL_TABLET | Freq: Every day | ORAL | Status: DC
  Administered 2017-05-05: 40 mg via ORAL
  Filled 2017-05-04: qty 1

## 2017-05-04 MED ORDER — CEFAZOLIN SODIUM-DEXTROSE 1-4 GM/50ML-% IV SOLN
1.0000 g | Freq: Four times a day (QID) | INTRAVENOUS | Status: AC
  Administered 2017-05-04 (×2): 1 g via INTRAVENOUS
  Filled 2017-05-04 (×2): qty 50

## 2017-05-04 MED ORDER — LACTATED RINGERS IV SOLN
INTRAVENOUS | Status: DC
  Administered 2017-05-04 (×2): via INTRAVENOUS

## 2017-05-04 MED ORDER — EPHEDRINE 5 MG/ML INJ
INTRAVENOUS | Status: AC
  Filled 2017-05-04: qty 10

## 2017-05-04 MED ORDER — BUPIVACAINE-EPINEPHRINE (PF) 0.5% -1:200000 IJ SOLN
INTRAMUSCULAR | Status: DC | PRN
  Administered 2017-05-04: 30 mL via PERINEURAL

## 2017-05-04 MED ORDER — SENNOSIDES-DOCUSATE SODIUM 8.6-50 MG PO TABS: 1.0000 | ORAL_TABLET | Freq: Every evening | ORAL | Status: DC | PRN

## 2017-05-04 MED ORDER — SODIUM CHLORIDE 0.9 % IR SOLN
Status: DC | PRN
  Administered 2017-05-04: 3000 mL

## 2017-05-04 MED ORDER — FENTANYL CITRATE (PF) 100 MCG/2ML IJ SOLN
50.0000 ug | Freq: Once | INTRAMUSCULAR | Status: AC
  Administered 2017-05-04: 50 ug via INTRAVENOUS

## 2017-05-04 MED ORDER — TAMSULOSIN HCL 0.4 MG PO CAPS: 0.4000 mg | ORAL_CAPSULE | Freq: Every day | ORAL | Status: DC

## 2017-05-04 MED ORDER — CHLORHEXIDINE GLUCONATE 4 % EX LIQD: 60.0000 mL | Freq: Once | CUTANEOUS | Status: DC

## 2017-05-04 MED ORDER — ACETAMINOPHEN 650 MG RE SUPP: 650.0000 mg | Freq: Four times a day (QID) | RECTAL | Status: DC | PRN

## 2017-05-04 MED ORDER — ONDANSETRON HCL 4 MG/2ML IJ SOLN
INTRAMUSCULAR | Status: DC | PRN
  Administered 2017-05-04: 4 mg via INTRAVENOUS

## 2017-05-04 MED ORDER — BUPIVACAINE-EPINEPHRINE (PF) 0.25% -1:200000 IJ SOLN
INTRAMUSCULAR | Status: AC
  Filled 2017-05-04: qty 30

## 2017-05-04 MED ORDER — METOCLOPRAMIDE HCL 5 MG PO TABS: 5.0000 mg | ORAL_TABLET | Freq: Three times a day (TID) | ORAL | Status: DC | PRN

## 2017-05-04 MED ORDER — FENTANYL CITRATE (PF) 250 MCG/5ML IJ SOLN
INTRAMUSCULAR | Status: AC
  Filled 2017-05-04: qty 5

## 2017-05-04 MED ORDER — BUPIVACAINE-EPINEPHRINE (PF) 0.5% -1:200000 IJ SOLN
INTRAMUSCULAR | Status: AC
  Filled 2017-05-04: qty 30

## 2017-05-04 MED ORDER — METHOCARBAMOL 1000 MG/10ML IJ SOLN: 500.0000 mg | Freq: Four times a day (QID) | INTRAMUSCULAR | Status: DC | PRN

## 2017-05-04 MED ORDER — SODIUM CHLORIDE 0.9 % IJ SOLN
INTRAMUSCULAR | Status: DC | PRN
  Administered 2017-05-04: 20 mL via INTRAVENOUS

## 2017-05-04 MED ORDER — LIDOCAINE 2% (20 MG/ML) 5 ML SYRINGE
INTRAMUSCULAR | Status: DC | PRN
  Administered 2017-05-04: 60 mg via INTRAVENOUS

## 2017-05-04 SURGICAL SUPPLY — 66 items
BANDAGE ESMARK 6X9 LF (GAUZE/BANDAGES/DRESSINGS) ×1 IMPLANT
BEARING TIBIAL IBALUKASZ4 8 (Knees) ×2 IMPLANT
BEARING TIBIAL IBALUKASZ4 8MM (Knees) ×1 IMPLANT
BLADE SAW RECIP 87.9 MT (BLADE) IMPLANT
BLADE SAW SGTL 13X75X1.27 (BLADE) IMPLANT
BLADE SAW SGTL 83.5X18.5 (BLADE) ×3 IMPLANT
BNDG ELASTIC 6X10 VLCR STRL LF (GAUZE/BANDAGES/DRESSINGS) ×3 IMPLANT
BNDG ESMARK 6X9 LF (GAUZE/BANDAGES/DRESSINGS) ×3
BOWL SMART MIX CTS (DISPOSABLE) ×3 IMPLANT
CEMENT BONE SIMPLEX SPEEDSET (Cement) ×3 IMPLANT
CLOSURE WOUND 1/2 X4 (GAUZE/BANDAGES/DRESSINGS) ×1
COVER SURGICAL LIGHT HANDLE (MISCELLANEOUS) ×3 IMPLANT
CUFF TOURNIQUET SINGLE 34IN LL (TOURNIQUET CUFF) ×3 IMPLANT
DRAPE EXTREMITY T 121X128X90 (DRAPE) ×3 IMPLANT
DRAPE HALF SHEET 40X57 (DRAPES) ×3 IMPLANT
DRAPE IMP U-DRAPE 54X76 (DRAPES) ×3 IMPLANT
DRAPE INCISE IOBAN 66X45 STRL (DRAPES) ×6 IMPLANT
DRAPE U-SHAPE 47X51 STRL (DRAPES) ×3 IMPLANT
DRESSING AQUACEL AG SP 3.5X10 (GAUZE/BANDAGES/DRESSINGS) ×1 IMPLANT
DRSG AQUACEL AG SP 3.5X10 (GAUZE/BANDAGES/DRESSINGS) ×3
DURAPREP 26ML APPLICATOR (WOUND CARE) ×6 IMPLANT
ELECT REM PT RETURN 9FT ADLT (ELECTROSURGICAL) ×3
ELECTRODE REM PT RTRN 9FT ADLT (ELECTROSURGICAL) ×1 IMPLANT
FEMORAL IBALANCE LT UKA SZ4 (Knees) ×3 IMPLANT
FLUID NSS /IRRIG 3000 ML XXX (IV SOLUTION) ×3 IMPLANT
GLOVE BIOGEL M 7.0 STRL (GLOVE) ×3 IMPLANT
GLOVE BIOGEL PI IND STRL 6.5 (GLOVE) ×1 IMPLANT
GLOVE BIOGEL PI IND STRL 7.5 (GLOVE) ×1 IMPLANT
GLOVE BIOGEL PI IND STRL 8.5 (GLOVE) ×2 IMPLANT
GLOVE BIOGEL PI INDICATOR 6.5 (GLOVE) ×2
GLOVE BIOGEL PI INDICATOR 7.5 (GLOVE) ×2
GLOVE BIOGEL PI INDICATOR 8.5 (GLOVE) ×4
GLOVE SURG ORTHO 8.0 STRL STRW (GLOVE) ×3 IMPLANT
GLOVE SURG SS PI 6.5 STRL IVOR (GLOVE) ×9 IMPLANT
GOWN STRL REUS W/ TWL LRG LVL3 (GOWN DISPOSABLE) ×1 IMPLANT
GOWN STRL REUS W/ TWL XL LVL3 (GOWN DISPOSABLE) ×2 IMPLANT
GOWN STRL REUS W/TWL 2XL LVL3 (GOWN DISPOSABLE) IMPLANT
GOWN STRL REUS W/TWL LRG LVL3 (GOWN DISPOSABLE) ×2
GOWN STRL REUS W/TWL XL LVL3 (GOWN DISPOSABLE) ×4
HANDPIECE INTERPULSE COAX TIP (DISPOSABLE) ×2
HOOD PEEL AWAY FACE SHEILD DIS (HOOD) ×9 IMPLANT
KIT BASIN OR (CUSTOM PROCEDURE TRAY) ×3 IMPLANT
KIT ROOM TURNOVER OR (KITS) ×3 IMPLANT
MANIFOLD NEPTUNE II (INSTRUMENTS) ×3 IMPLANT
NEEDLE 21X1 OR PACK (NEEDLE) ×3 IMPLANT
NEEDLE HYPO 21X1 ECLIPSE (NEEDLE) ×3 IMPLANT
NS IRRIG 1000ML POUR BTL (IV SOLUTION) ×3 IMPLANT
PACK BLADE SAW RECIP 70 3 PT (BLADE) ×3 IMPLANT
PACK TOTAL JOINT (CUSTOM PROCEDURE TRAY) ×3 IMPLANT
PAD ARMBOARD 7.5X6 YLW CONV (MISCELLANEOUS) ×6 IMPLANT
SET HNDPC FAN SPRY TIP SCT (DISPOSABLE) ×1 IMPLANT
STRIP CLOSURE SKIN 1/2X4 (GAUZE/BANDAGES/DRESSINGS) ×2 IMPLANT
SUCTION FRAZIER HANDLE 10FR (MISCELLANEOUS) ×2
SUCTION TUBE FRAZIER 10FR DISP (MISCELLANEOUS) ×1 IMPLANT
SUT MNCRL AB 3-0 PS2 18 (SUTURE) ×3 IMPLANT
SUT VIC AB 0 CT1 27 (SUTURE) ×6
SUT VIC AB 0 CT1 27XBRD ANBCTR (SUTURE) ×3 IMPLANT
SUT VIC AB 1 CT1 27 (SUTURE) ×4
SUT VIC AB 1 CT1 27XBRD ANBCTR (SUTURE) ×2 IMPLANT
SUT VIC AB 2-0 CT1 27 (SUTURE) ×4
SUT VIC AB 2-0 CT1 TAPERPNT 27 (SUTURE) ×2 IMPLANT
SYR 20CC LL (SYRINGE) ×6 IMPLANT
TOWEL OR 17X24 6PK STRL BLUE (TOWEL DISPOSABLE) ×3 IMPLANT
TOWEL OR 17X26 10 PK STRL BLUE (TOWEL DISPOSABLE) ×3 IMPLANT
TRAY TIBIAL IBALANCE UKA MED (Knees) ×3 IMPLANT
WRAP KNEE MAXI GEL POST OP (GAUZE/BANDAGES/DRESSINGS) ×3 IMPLANT

## 2017-05-04 NOTE — Telephone Encounter (Signed)
Pt is having surgery today and I have mailed him a letter to call us when he hears from [facility].

## 2017-05-04 NOTE — Anesthesia Procedure Notes (Signed)
Anesthesia Regional Block: Adductor canal block   Pre-Anesthetic Checklist: ,, timeout performed, Correct Patient, Correct Site, Correct Laterality, Correct Procedure, Correct Position, site marked, Risks and benefits discussed,  Surgical consent,  Pre-op evaluation,  At surgeon's request and post-op pain management  Laterality: Lower and Left  Prep: chloraprep       Needles:  Injection technique: Single-shot  Needle Type: Echogenic Stimulator Needle          Additional Needles:   Procedures: ultrasound guided,,,,,,,,  Narrative:  Start time: 05/04/2017 10:55 AM End time: 05/04/2017 10:59 AM Injection made incrementally with aspirations every 5 mL.  Performed by: Personally  Anesthesiologist: Linn Clavin  Additional Notes: H+P and labs reviewed, risks and benefits discussed with patient, procedure tolerated well without complications

## 2017-05-04 NOTE — Progress Notes (Signed)
Orthopedic Tech Progress Note Patient Details:  YURIY CUI Jr. 01/24/1935 [id number]  CPM Left Knee CPM Left Knee: On Left Knee Flexion (Degrees): 90 Left Knee Extension (Degrees): 0 Additional Comments: applied cpm to pt left knee at 0-90 degress.  pt tolerated very well.  Left knee.   Kristopher Oppenheim 05/04/2017, 2:33 PM

## 2017-05-04 NOTE — Transfer of Care (Signed)
Immediate Anesthesia Transfer of Care Note  Patient: Louis Theiler Fricke Jr.  Procedure(s) Performed: Procedure(s): UNICOMPARTMENTAL KNEE (Left)  Patient Location: PACU  Anesthesia Type:Regional and general  Level of Consciousness: awake, oriented and patient cooperative  Airway & Oxygen Therapy: Patient Spontanous Breathing and Patient connected to nasal cannula oxygen  Post-op Assessment: Report given to RN and Post -op Vital signs reviewed and stable  Post vital signs: Reviewed and stable  Last Vitals:  Vitals:   05/04/17 1112 05/04/17 1335  BP: (!) 116/57   Pulse: (!) 55   Resp: 11   Temp:  36.3 C    Last Pain:  Vitals:   05/04/17 0808  TempSrc: Oral      Patients Stated Pain Goal: 3 (84/13/24 4010)  Complications: No apparent anesthesia complications

## 2017-05-04 NOTE — Evaluation (Addendum)
Physical Therapy Evaluation Patient Details Name: Louis Bowers. MRN: [id number] DOB: 1935-05-20 Today's Date: 05/04/2017   History of Present Illness  82 y.o. male admitted on 05/04/17 for elective L TKA.  Pt with significant PMH of UC, HTN, chronic renal insufficiency III, A-fib, clatter CA s/p chemo, pancreatitis, bladder surgery, and multiple back surgeries.    Clinical Impression  Pt is POD #0 and moving well, only a little limited by lightheadedness, but was able to make it into the hallway a short distance and sit up in the chair.  Knee education initiated and HEP exercises started.   PT to follow acutely for deficits listed below.     Follow Up Recommendations DC plan and follow up therapy as arranged by surgeon    Equipment Recommendations  None recommended by PT    Recommendations for Other Services   NA    Precautions / Restrictions Precautions Precautions: Knee Precaution Booklet Issued: Yes (comment) Precaution Comments: knee exercise handout given, WB status and no pillow under operative knee reviewed.  Restrictions Weight Bearing Restrictions: No LLE Weight Bearing: Weight bearing as tolerated      Mobility  Bed Mobility Overal bed mobility: Modified Independent             General bed mobility comments: Pt able to get EOB using railing and move his own leg.   Transfers Overall transfer level: Needs assistance Equipment used: Rolling walker (2 wheeled) Transfers: Sit to/from Stand Sit to Stand: Min assist         General transfer comment: Min assist to steady trunk during transitions and stabilize RW.  Pt self cueing correct hand placement.   Ambulation/Gait Ambulation/Gait assistance: Min guard Ambulation Distance (Feet): 35 Feet Assistive device: Rolling walker (2 wheeled) Gait Pattern/deviations: Step-through pattern;Antalgic     General Gait Details: Mildly antalgic gait pattern, verbal cues for upright posture and to breathe.  Pt  reporting lightheadedness throughout gait, so gait distance limited a bit to ensure safety.           Balance Overall balance assessment: Needs assistance Sitting-balance support: Feet supported;No upper extremity supported Sitting balance-Leahy Scale: Good     Standing balance support: Bilateral upper extremity supported Standing balance-Leahy Scale: Fair                               Pertinent Vitals/Pain Pain Assessment: Faces Faces Pain Scale: Hurts little more Pain Location: left knee Pain Descriptors / Indicators: Aching;Burning Pain Intervention(s): Limited activity within patient's tolerance;Monitored during session;Repositioned;Ice applied    Home Living Family/patient expects to be discharged to:: Private residence Living Arrangements: Spouse/significant other Available Help at Discharge: Family;Available 24 hours/day Type of Home: House Home Access: Stairs to enter   [employer] of Steps: 1 Home Layout: One level Home Equipment: Walker - 2 wheels;Cane - single point;Bedside commode;Shower seat      Prior Function Level of Independence: Independent         Comments: pt still works part time        English as a second language teacher Extremity Assessment Upper Extremity Assessment: Defer to OT evaluation    Lower Extremity Assessment Lower Extremity Assessment: LLE deficits/detail LLE Deficits / Details: left leg with normal post op pain and weakness.  Ankle at least 3/5, knee3-/5 extension (seated), hip flexion 3-5 LLE Sensation:  (intact to LT)    Cervical / Trunk Assessment Cervical / Trunk Assessment: Other exceptions Cervical /  Trunk Exceptions: pt with three listed back surgeries  Communication   Communication: No difficulties  Cognition Arousal/Alertness: Awake/alert Behavior During Therapy: WFL for tasks assessed/performed Overall Cognitive Status: Within Functional Limits for tasks assessed                                            Exercises Total Joint Exercises Ankle Circles/Pumps: AROM;Both;20 reps Long Arc Quad: AROM;Left;10 reps   Assessment/Plan    PT Assessment Patient needs continued PT services  PT Problem List Decreased strength;Decreased range of motion;Decreased activity tolerance;Decreased balance;Decreased mobility;Decreased knowledge of use of DME;Decreased knowledge of precautions;Pain       PT Treatment Interventions DME instruction;Gait training;Stair training;Functional mobility training;Therapeutic activities;Therapeutic exercise;Balance training;Patient/family education;Manual techniques;Modalities    PT Goals (Current goals can be found in the Care Plan section)  Acute Rehab PT Goals Patient Stated Goal: to get back to moving well PT Goal Formulation: With patient/family Time For Goal Achievement: 05/18/17 Potential to Achieve Goals: Good    Frequency 7X/week    AM-PAC PT "6 Clicks" Daily Activity  Outcome Measure Difficulty turning over in bed (including adjusting bedclothes, sheets and blankets)?: A Little Difficulty moving from lying on back to sitting on the side of the bed? : A Little Difficulty sitting down on and standing up from a chair with arms (e.g., wheelchair, bedside commode, etc,.)?: Total Help needed moving to and from a bed to chair (including a wheelchair)?: A Little Help needed walking in hospital room?: A Little Help needed climbing 3-5 steps with a railing? : A Little 6 Click Score: 16    End of Session Equipment Utilized During Treatment: Gait belt Activity Tolerance: Other (comment) (limited by lightheadedness) Patient left: in chair;with call bell/phone within reach;with chair alarm set;with family/visitor present   PT Visit Diagnosis: Muscle weakness (generalized) (M62.81);Difficulty in walking, not elsewhere classified (R26.2);Pain Pain - Right/Left: Left Pain - part of body: Knee    Time: 2411-4643 PT Time  Calculation (min) (ACUTE ONLY): 34 min   Charges:        Wells Guiles B. Dorthey Depace, PT, DPT [phone]   PT Evaluation $PT Eval Moderate Complexity: 1 Procedure PT Treatments $Gait Training: 8-22 mins   05/04/2017, 9:46 PM

## 2017-05-04 NOTE — Anesthesia Procedure Notes (Signed)
Procedure Name: LMA Insertion Date/Time: 05/04/2017 11:28 AM Performed by: Willeen Cass P Pre-anesthesia Checklist: Patient identified, Emergency Drugs available, Suction available and Patient being monitored Patient Re-evaluated:Patient Re-evaluated prior to inductionOxygen Delivery Method: Circle System Utilized Preoxygenation: Pre-oxygenation with 100% oxygen Intubation Type: IV induction Ventilation: Mask ventilation without difficulty LMA: LMA inserted LMA Size: 5.0 Number of attempts: 1 Placement Confirmation: positive ETCO2 Tube secured with: Tape Dental Injury: Teeth and Oropharynx as per pre-operative assessment  Comments: IV induction with Dr Roanna Banning. Small abrasion to upper R lip noted, lubricant applied.

## 2017-05-04 NOTE — Anesthesia Preprocedure Evaluation (Addendum)
Anesthesia Evaluation  Patient identified by MRN, date of birth, ID band Patient awake    Reviewed: Allergy & Precautions, NPO status , Patient's Chart, lab work & pertinent test results  History of Anesthesia Complications Negative for: history of anesthetic complications  Airway Mallampati: I  TM Distance: <3 FB Neck ROM: Full    Dental  (+) Teeth Intact, Dental Advisory Given   Pulmonary neg pulmonary ROS,    breath sounds clear to auscultation       Cardiovascular hypertension, Pt. on medications Normal cardiovascular exam+ dysrhythmias Atrial Fibrillation  Rate:Bradycardia     Neuro/Psych negative neurological ROS  negative psych ROS   GI/Hepatic Neg liver ROS, PUD, GERD  Medicated and Controlled,UC and chronic pancreatitis   Endo/Other  Hypothyroidism   Renal/GU CRFRenal disease     Musculoskeletal  (+) Arthritis ,   Abdominal   Peds  Hematology On eliquis with uncertain stop date   Anesthesia Other Findings   Reproductive/Obstetrics                            Anesthesia Physical Anesthesia Plan  ASA: III  Anesthesia Plan: General and Regional   Post-op Pain Management:    Induction: Intravenous  PONV Risk Score and Plan: 2 and Ondansetron and Dexamethasone  Airway Management Planned: LMA and Oral ETT  Additional Equipment: None  Intra-op Plan:   Post-operative Plan: Extubation in OR  Informed Consent: I have reviewed the patients History and Physical, chart, labs and discussed the procedure including the risks, benefits and alternatives for the proposed anesthesia with the patient or authorized representative who has indicated his/her understanding and acceptance.   Dental advisory given  Plan Discussed with: CRNA and Surgeon  Anesthesia Plan Comments:         Anesthesia Quick Evaluation

## 2017-05-05 ENCOUNTER — Encounter (HOSPITAL_COMMUNITY): Payer: Self-pay | Admitting: General Practice

## 2017-05-05 DIAGNOSIS — M1712 Unilateral primary osteoarthritis, left knee: Secondary | ICD-10-CM | POA: Diagnosis not present

## 2017-05-05 LAB — CBC
HEMATOCRIT: 37.8 % — AB (ref 39.0–52.0)
HEMOGLOBIN: 11.8 g/dL — AB (ref 13.0–17.0)
MCH: 28.7 pg (ref 26.0–34.0)
MCHC: 31.2 g/dL (ref 30.0–36.0)
MCV: 92 fL (ref 78.0–100.0)
Platelets: 200 10*3/uL (ref 150–400)
RBC: 4.11 MIL/uL — ABNORMAL LOW (ref 4.22–5.81)
RDW: 14.7 % (ref 11.5–15.5)
WBC: 12.6 10*3/uL — ABNORMAL HIGH (ref 4.0–10.5)

## 2017-05-05 LAB — BASIC METABOLIC PANEL
Anion gap: 11 (ref 5–15)
BUN: 20 mg/dL (ref 6–20)
CHLORIDE: 103 mmol/L (ref 101–111)
CO2: 25 mmol/L (ref 22–32)
CREATININE: 1.29 mg/dL — AB (ref 0.61–1.24)
Calcium: 8.8 mg/dL — ABNORMAL LOW (ref 8.9–10.3)
GFR calc non Af Amer: 51 mL/min — ABNORMAL LOW (ref >60)
GFR, EST AFRICAN AMERICAN: 59 mL/min — AB (ref >60)
GLUCOSE: 135 mg/dL — AB (ref 65–99)
Potassium: 4.2 mmol/L (ref 3.5–5.1)
Sodium: 139 mmol/L (ref 135–145)

## 2017-05-05 MED ORDER — METHOCARBAMOL 500 MG PO TABS: 500.0000 mg | ORAL_TABLET | Freq: Four times a day (QID) | ORAL | 0 refills | Status: DC | PRN

## 2017-05-05 MED ORDER — OXYCODONE HCL 10 MG PO TABS: 5.0000 mg | ORAL_TABLET | ORAL | 0 refills | Status: DC | PRN

## 2017-05-05 NOTE — Discharge Instructions (Signed)

## 2017-05-05 NOTE — Op Note (Addendum)
UNI KNEE REPLACEMENT OPERATIVE NOTE:  05/04/2017  6:10 PM  PATIENT:  Louis Spitz Chinn Jr.  82 y.o. male  PRE-OPERATIVE DIAGNOSIS:  primary osteoarthritis left knee  POST-OPERATIVE DIAGNOSIS:  primary osteoarthritis left knee  PROCEDURE:  Procedure(s): UNICOMPARTMENTAL KNEE  SURGEON:  Surgeon(s): Vickey Huger, MD  PHYSICIAN ASSISTANT: Buck Mam, PAC  ANESTHESIA:   spinal  DRAINS: Hemovac  SPECIMEN: None  COUNTS:  Correct  TOURNIQUET:   Total Tourniquet Time Documented: Thigh (Left) - 52 minutes Total: Thigh (Left) - 52 minutes   DICTATION:  Indication for procedure:    The patient is a 82 y.o. male who has failed conservative treatment for primary osteoarthritis left knee.  Informed consent was obtained prior to anesthesia. The risks versus benefits of the operation were explain and in a way the patient can, and did, understand.   On the implant demand matching protocol, this patient scored 10.  Therefore, this patient was not receive a polyethylene insert with vitamin E which is a high demand implant.  Description of procedure:     The patient was taken to the operating room and placed under anesthesia.  The patient was positioned in the usual fashion taking care that all body parts were adequately padded and/or protected.  I foley catheter was not placed.  A tourniquet was applied and the leg prepped and draped in the usual sterile fashion.  The extremity was exsanguinated with the esmarch and tourniquet inflated to 350 mmHg.  Pre-operative range of motion was normal.  The knee was in 5 degree of mild varus.  A midline incision approximately 3-4 inches long was made with a #10 blade.  A new blade was used to make a parapatellar arthrotomy going 1 cm into the quadriceps tendon, over the patella, and alongside the medial aspect of the patellar tendon.  A synovectomy was then performed with the #10 blade and forceps. I then elevated the deep MCL off the medial tibial  flare. The knee was put at 90 degrees and the patient specific cutting blocks were used to make our proximal tibial cut and distal femoral cut. The medial meniscus was removed at this point.  I then used the 4 cutting guide on the femur to drill for lugs and cut the chamfers. Likewise, a 4 tibial baseplate was used to prepare the tibia. I then trialed the 4 femur and 4 tibia. I trialed several poly inserts and a 8 mm achieved good balance in flexion and extension.  I then irrigated copiously and then mixed the cement. I injected exparel in the deep soft tissues at this point. I then cemented the tibia first followed by the femur and removed excess cement and then inserted the polyethylene. I placed the leg in extension and finished injecting the rest of the exparel.  BLOOD LOSS:  300cc DRAINS: 1 hemovac, 1 pain catheter COMPLICATIONS:  None.  PLAN OF CARE: Admit for overnight observation  PATIENT DISPOSITION:  PACU - hemodynamically stable.   Delay start of Pharmacological VTE agent (>24hrs) due to surgical blood loss or risk of bleeding:  not applicable  Please fax a copy of this op note to my office at [phone] (please only include page 1 and 2 of the Case Information op note)

## 2017-05-05 NOTE — Care Management Note (Signed)
Case Management Note  Patient Details  Name: Louis Bowers. MRN: [id number] Date of Birth: 1935-10-07  Subjective/Objective:   82 yr old gentleman s/p left unicompartmental knee arthroplasty.                  Action/Plan: Case manager spoke with patient and his wife concerning discharge plan and DME needs. Patient lives in [place]. CM called referral to [facility], [phone], faxed orders, F2F and demographics to them at [phone]. Patient will have family support at discharge.    Expected Discharge Date:  05/05/17               Expected Discharge Plan:  [place]  In-House Referral:  NA  Discharge planning Services  CM Consult  Post Acute Care Choice:  Durable Medical Equipment Choice offered to:  Patient, Spouse  DME Arranged:  3-N-1, CPM, Walker rolling DME Agency:  [employer]  HH Arranged:  PT HH Agency:  Other - See comment ([place])  Status of Service:  Completed, signed off  If discussed at [place] of Stay Meetings, dates discussed:    Additional Comments:  Ninfa Meeker, RN 05/05/2017, 2:12 PM

## 2017-05-05 NOTE — Evaluation (Signed)
Occupational Therapy Evaluation Patient Details Name: Louis Bowers. MRN: [id number] DOB: [birth year] Today's Date: 05/05/2017    History of Present Illness 82 y.o. male admitted on 05/04/17 for elective L TKA.  Pt with significant PMH of UC, HTN, chronic renal insufficiency III, A-fib, clatter CA s/p chemo, pancreatitis, bladder surgery, and multiple back surgeries.     Clinical Impression   PTA, pt was living with huis wife and was independent. Currently, pt requires supervision-Min guard A for ADLs and functional mobility using RW. Provided education on LB ADLs, toilet transfer, and shower transfer with shower seat; pt demonstrated understanding. Educated pt and family on fall prevention, RW management, and knee precautions; pt and wife verbalized understanding. Answered all pt questions. Recommend dc home once medically stable per physician. All acute OT needs met and will sign off. Thank you.     Follow Up Recommendations  DC plan and follow up therapy as arranged by surgeon;Supervision/Assistance - 24 hour    Equipment Recommendations  None recommended by OT    Recommendations for Other Services PT consult     Precautions / Restrictions Precautions Precautions: Knee Precaution Booklet Issued: Yes (comment) Precaution Comments: Reviewed resting with knee in extension without anything under the knee Restrictions Weight Bearing Restrictions: No LLE Weight Bearing: Weight bearing as tolerated      Mobility Bed Mobility Overal bed mobility: Modified Independent             General bed mobility comments: Set up bed to simulate home environment with [facility] and bedrail lowered.   Transfers Overall transfer level: Needs assistance Equipment used: Rolling walker (2 wheeled);None Transfers: Sit to/from Stand Sit to Stand: Min guard         General transfer comment: Min guard for safety. Pt able to perform sit<>stand without RW during dressing. Then used RW for functional  transfers and mobility    Balance Overall balance assessment: Needs assistance Sitting-balance support: Feet supported;No upper extremity supported Sitting balance-Leahy Scale: Good Sitting balance - Comments: Able to perform LB dressing   Standing balance support: Bilateral upper extremity supported Standing balance-Leahy Scale: Fair Standing balance comment: Able to perform LB dressing                           ADL either performed or assessed with clinical judgement   ADL Overall ADL's : Needs assistance/impaired                                       General ADL Comments: Pt performing ADLs and functional mobility at supervision-Min guard level. Provided education on LB dressing, toilet transfer, and walk-in shower transfer. Pt demonstrated understanding and wife present throughout. Educated pt on fall prevention, car transfer, and RW management; pt verbalized understanding.  During functional mobility, pt had tendency to drift to R side and accidentally ran into thing twice on R.     Vision         Perception     Praxis      Pertinent Vitals/Pain Pain Assessment: Faces Faces Pain Scale: Hurts a little bit Pain Location: left knee Pain Descriptors / Indicators: Aching;Burning Pain Intervention(s): Monitored during session;Repositioned     Hand Dominance Right   Extremity/Trunk Assessment Upper Extremity Assessment Upper Extremity Assessment: Overall WFL for tasks assessed   Lower Extremity Assessment Lower Extremity Assessment: Defer to PT evaluation  LLE Deficits / Details: left leg with normal post op pain and weakness.  Ankle at least 3/5, knee3-/5 extension (seated), hip flexion 3-5 LLE Sensation:  (intact to LT)   Cervical / Trunk Assessment Cervical / Trunk Assessment: Other exceptions Cervical / Trunk Exceptions: pt with three listed back surgeries   Communication Communication Communication: No difficulties   Cognition  Arousal/Alertness: Awake/alert Behavior During Therapy: WFL for tasks assessed/performed Overall Cognitive Status: Within Functional Limits for tasks assessed                                 General Comments: Pt presenting with some decreased memory as seen by repeating questions to therapist or having difficulty answering home setup questions - wife had to correct pt for home information   General Comments  Wife present throughout    Exercises     Shoulder Instructions      Home Living Family/patient expects to be discharged to:: Private residence Living Arrangements: Spouse/significant other Available Help at Discharge: Family;Available 24 hours/day Type of Home: House Home Access: Stairs to enter [employer] of Steps: 1   Home Layout: One level     Bathroom Shower/Tub: Occupational psychologist: Standard     Home Equipment: Environmental consultant - 2 wheels;Cane - single point;Bedside commode;Shower seat;Grab bars - tub/shower          Prior Functioning/Environment Level of Independence: Independent        Comments: pt still works part time        OT Problem List: Decreased strength;Decreased activity tolerance;Impaired balance (sitting and/or standing);Decreased safety awareness;Decreased knowledge of use of DME or AE;Decreased knowledge of precautions;Pain      OT Treatment/Interventions:      OT Goals(Current goals can be found in the care plan section) Acute Rehab OT Goals Patient Stated Goal: to get back to moving well OT Goal Formulation: With patient Time For Goal Achievement: 05/19/17 Potential to Achieve Goals: Good  OT Frequency:     Barriers to D/C:            Co-evaluation              AM-PAC PT "6 Clicks" Daily Activity     Outcome Measure Help from another person eating meals?: None Help from another person taking care of personal grooming?: None Help from another person toileting, which includes using toliet,  bedpan, or urinal?: A Little Help from another person bathing (including washing, rinsing, drying)?: A Little Help from another person to put on and taking off regular upper body clothing?: None Help from another person to put on and taking off regular lower body clothing?: A Little 6 Click Score: 21   End of Session Equipment Utilized During Treatment: Rolling walker CPM Left Knee CPM Left Knee: Off Nurse Communication: Mobility status  Activity Tolerance: Patient tolerated treatment well Patient left: in chair;with call bell/phone within reach;with family/visitor present  OT Visit Diagnosis: Unsteadiness on feet (R26.81);Other abnormalities of gait and mobility (R26.89);Muscle weakness (generalized) (M62.81);Pain Pain - Right/Left: Left Pain - part of body: Knee                Time: 8144-8185 OT Time Calculation (min): 30 min Charges:  OT General Charges $OT Visit: 1 Procedure OT Evaluation $OT Eval Low Complexity: 1 Procedure OT Treatments $Self Care/Home Management : 8-22 mins G-Codes: OT G-codes **NOT FOR INPATIENT CLASS** Functional Assessment Tool Used: Clinical judgement  Functional Limitation: Self care Self Care Current Status [phone]): At least 1 percent but less than 20 percent impaired, limited or restricted Self Care Goal Status (S0123): At least 1 percent but less than 20 percent impaired, limited or restricted Self Care Discharge Status [phone]): At least 1 percent but less than 20 percent impaired, limited or restricted   [place], OTR/L Acute Rehab Pager: [phone] Office: [place] 05/05/2017, 10:57 AM

## 2017-05-05 NOTE — Progress Notes (Signed)
Physical Therapy Treatment Patient Details Name: Louis Bowers. MRN: [id number] DOB: 1935-04-24 Today's Date: 05/05/2017    History of Present Illness 82 y.o. male admitted on 05/04/17 for elective L TKA.  Pt with significant PMH of UC, HTN, chronic renal insufficiency III, A-fib, clatter CA s/p chemo, pancreatitis, bladder surgery, and multiple back surgeries.      PT Comments    Pt with increased activity tolerance today. Negotiated steps with min guard and is progressing well towards goals. Expected to d/c home this pm.  Follow Up Recommendations  DC plan and follow up therapy as arranged by surgeon     Equipment Recommendations  None recommended by PT    Recommendations for Other Services       Precautions / Restrictions Precautions Precautions: Knee Precaution Booklet Issued: Yes (comment) Precaution Comments: Reviewed resting with knee in extension without anything under the knee Restrictions Weight Bearing Restrictions: No LLE Weight Bearing: Weight bearing as tolerated    Mobility  Bed Mobility Overal bed mobility: Modified Independent             General bed mobility comments: in chair on arrival  Transfers Overall transfer level: Needs assistance Equipment used: Rolling walker (2 wheeled);None Transfers: Sit to/from Stand Sit to Stand: Min guard         General transfer comment: Min guard for safety.  Ambulation/Gait Ambulation/Gait assistance: Min guard Ambulation Distance (Feet): 280 Feet Assistive device: Rolling walker (2 wheeled) Gait Pattern/deviations: Step-through pattern;Antalgic Gait velocity: decreased Gait velocity interpretation: Below normal speed for age/gender General Gait Details: Mildly antalgic gait pattern, verbal cues for walker proximity and instruction on turning with RW.   Stairs Stairs: Yes   Stair Management: No rails Number of Stairs: 1 (3x) General stair comments: Pt with curb like step at home. Negotiate step  with min guard after instructing pt on proper technique. Pt with some memory difficulties; however wife was pressent for instruction and can assist pt.  Wheelchair Mobility    Modified Rankin (Stroke Patients Only)       Balance Overall balance assessment: Needs assistance Sitting-balance support: Feet supported;No upper extremity supported Sitting balance-Leahy Scale: Good Sitting balance - Comments: Able to perform LB dressing   Standing balance support: Bilateral upper extremity supported Standing balance-Leahy Scale: Fair Standing balance comment: Able to stand to urinate w/o UE support.                            Cognition Arousal/Alertness: Awake/alert Behavior During Therapy: WFL for tasks assessed/performed Overall Cognitive Status: Within Functional Limits for tasks assessed                                 General Comments: Pt presenting with some decreased memory as seen by repeating questions to therapist or having difficulty answering home setup questions - wife had to correct pt for home information      Exercises Total Joint Exercises Short Arc Quad: AROM;Left;15 reps;Seated Other Exercises Other Exercises: Pt with many questions reguarding HEP and proper technique. Verbally discussed HEP with pt and answered all questions. Had pt perform short arc quads to clarify technique.    General Comments General comments (skin integrity, edema, etc.): wife present for session      Pertinent Vitals/Pain Pain Assessment: Faces Faces Pain Scale: Hurts a little bit Pain Location: left knee Pain Descriptors / Indicators: Aching;Burning  Pain Intervention(s): Monitored during session    Home Living Family/patient expects to be discharged to:: Private residence Living Arrangements: Spouse/significant other Available Help at Discharge: Family;Available 24 hours/day Type of Home: House Home Access: Stairs to enter   Home Layout: One level Home  Equipment: Environmental consultant - 2 wheels;Cane - single point;Bedside commode;Shower seat;Grab bars - tub/shower      Prior Function Level of Independence: Independent      Comments: pt still works part time   PT Goals (current goals can now be found in the care plan section) Acute Rehab PT Goals Patient Stated Goal: to get back to moving well PT Goal Formulation: With patient/family Time For Goal Achievement: 05/18/17 Potential to Achieve Goals: Good Progress towards PT goals: Progressing toward goals    Frequency    7X/week      PT Plan Current plan remains appropriate    Co-evaluation              AM-PAC PT "6 Clicks" Daily Activity  Outcome Measure  Difficulty turning over in bed (including adjusting bedclothes, sheets and blankets)?: A Little Difficulty moving from lying on back to sitting on the side of the bed? : A Little Difficulty sitting down on and standing up from a chair with arms (e.g., wheelchair, bedside commode, etc,.)?: Total Help needed moving to and from a bed to chair (including a wheelchair)?: A Little Help needed walking in hospital room?: A Little Help needed climbing 3-5 steps with a railing? : A Little 6 Click Score: 16    End of Session Equipment Utilized During Treatment: Gait belt Activity Tolerance: Patient tolerated treatment well Patient left: with call bell/phone within reach;with family/visitor present;in bed   PT Visit Diagnosis: Muscle weakness (generalized) (M62.81);Difficulty in walking, not elsewhere classified (R26.2);Pain Pain - Right/Left: Left Pain - part of body: Knee     Time: 0165-5374 PT Time Calculation (min) (ACUTE ONLY): 40 min  Charges:  $Gait Training: 23-37 mins $Therapeutic Exercise: 8-22 mins                    G Codes:      Benjiman Core, [place] Pager [phone] Acute Rehab   Allena Katz 05/05/2017, 11:26 AM

## 2017-05-05 NOTE — Progress Notes (Signed)
SPORTS MEDICINE AND JOINT REPLACEMENT  Lara Mulch, MD    Carlyon Shadow, PA-C [address], [place], [place]  [address]                             [phone]   PROGRESS NOTE  Subjective:  negative for Chest Pain  negative for Shortness of Breath  negative for Nausea/Vomiting   negative for Calf Pain  negative for Bowel Movement   Tolerating Diet: yes         Patient reports pain as 3 on 0-10 scale.    Objective: Vital signs in last 24 hours:   Patient Vitals for the past 24 hrs:  BP Temp Temp src Pulse Resp SpO2  05/05/17 0424 130/81 97.5 F (36.4 C) Oral 64 18 98 %  05/04/17 2021 (!) 149/85 98 F (36.7 C) Oral 69 18 95 %  05/04/17 1613 (!) 150/92 97.9 F (36.6 C) Oral 66 15 100 %  05/04/17 1550 (!) 144/82 - - 63 14 95 %  05/04/17 1535 (!) 154/90 - - 64 14 95 %  05/04/17 1520 (!) 149/93 - - 62 13 99 %  05/04/17 1505 (!) 128/98 97.5 F (36.4 C) - 65 13 96 %  05/04/17 1450 (!) 145/87 - - 63 12 98 %  05/04/17 1435 (!) 144/85 - - (!) 112 13 97 %  05/04/17 1420 139/83 - - 60 14 97 %  05/04/17 1405 (!) 142/83 - - 66 14 98 %  05/04/17 1350 135/85 - - 75 13 99 %  05/04/17 1335 127/77 97.3 F (36.3 C) - 72 14 95 %  05/04/17 1112 (!) 116/57 - - (!) 55 11 99 %  05/04/17 1105 (!) 146/72 - - (!) 50 10 98 %  05/04/17 1100 (!) 185/85 - - (!) 50 14 98 %  05/04/17 0814 138/74 - - - - -  05/04/17 0809 - - - - 20 -  05/04/17 0808 (!) 183/90 98.2 F (36.8 C) Oral (!) 57 (!) 0 96 %    @flow {1959:LAST@   Intake/Output from previous day:   06/25 0701 - 06/26 0700 In: 2140 [P.O.:240; I.V.:1900] Out: 1050 [Urine:1000]   Intake/Output this shift:   No intake/output data recorded.   Intake/Output      06/25 0701 - 06/26 0700 06/26 0701 - 06/27 0700   P.O. 240    I.V. 1900    Total Intake 2140     Urine 1000    Blood 50    Total Output 1050     Net +1090          Urine Occurrence 2 x       LABORATORY DATA:  Recent Labs  04/28/17 0928  WBC 8.3  HGB 11.8*   HCT 38.1*  PLT 207    Recent Labs  04/28/17 0928  NA 138  K 4.0  CL 105  CO2 25  BUN 29*  CREATININE 1.31*  GLUCOSE 113*  CALCIUM 9.0   Lab Results  Component Value Date   INR 0.93 05/04/2017    Examination:  General appearance: alert, cooperative and no distress Extremities: extremities normal, atraumatic, no cyanosis or edema  Wound Exam: clean, dry, intact   Drainage:  None: wound tissue dry  Motor Exam: Quadriceps and Hamstrings Intact  Sensory Exam: Superficial Peroneal, Deep Peroneal and Tibial normal   Assessment:    1 Day Post-Op  Procedure(s) (LRB): UNICOMPARTMENTAL KNEE (Left)  ADDITIONAL DIAGNOSIS:  Active Problems:   S/P left unicompartmental knee replacement    Plan: Physical Therapy as ordered Weight Bearing as Tolerated (WBAT)  DVT Prophylaxis:  eloquis  DISCHARGE PLAN: Home  DISCHARGE NEEDS: HHPT   Patient doing well, ready for D/C         Donia Ast 05/05/2017, 7:21 AM

## 2017-05-05 NOTE — Progress Notes (Signed)
Orthopedic Tech Progress Note Patient Details:  Louis Bowers. 11/13/1934 [id number]  Ortho Devices Type of Ortho Device: Knee Immobilizer Ortho Device/Splint Interventions: Application   [name] 05/05/2017, 12:57 PM

## 2017-05-05 NOTE — Progress Notes (Signed)
   05/04/17 2132  PT G-Codes **NOT FOR INPATIENT CLASS**  Functional Assessment Tool Used AM-PAC 6 Clicks Basic Mobility  Functional Limitation Mobility: Walking and moving around  Mobility: Walking and Moving Around Current Status [phone]) CK  Mobility: Walking and Moving Around Goal Status [phone]) CI  05/05/2017  late entry g code Wells Guiles B. [place], [place], DPT [phone]

## 2017-05-05 NOTE — Discharge Summary (Signed)
SPORTS MEDICINE & JOINT REPLACEMENT   Lara Mulch, MD   Carlyon Shadow, PA-C [address], [place], [place]  [address]                             [phone]  PATIENT ID: Louis Bowers.        MRN:  [id number]          DOB/AGE: May 21, 1935 / 82 y.o.    DISCHARGE SUMMARY  ADMISSION DATE:    05/04/2017 DISCHARGE DATE:   05/05/2017   ADMISSION DIAGNOSIS: primary osteoarthritis left knee    DISCHARGE DIAGNOSIS:  primary osteoarthritis left knee    ADDITIONAL DIAGNOSIS: Active Problems:   S/P left unicompartmental knee replacement  Past Medical History:  Diagnosis Date  . A-fib ([place])    Dr. Rosalita Chessman  . Bladder cancer ([place]) 2004   instilled chemo  . Chronic renal insufficiency, stage III (moderate)    [facility]  . Hypertension   . Hypothyroidism   . Osteoarthritis   . Pancreas divisum, native   . Pancreatitis    Dr.  Oliva Bustard. Shifflett  . UC (ulcerative colitis) ([place])    Dr. Spainhour/Dr. Shifflett: diagnosed 11/2011 involved entire colon    PROCEDURE: Procedure(s): UNICOMPARTMENTAL KNEE on 05/04/2017  CONSULTS:    HISTORY:  See H&P in chart  HOSPITAL COURSE:  Louis Bowers. is a 82 y.o. admitted on 05/04/2017 and found to have a diagnosis of primary osteoarthritis left knee.  After appropriate laboratory studies were obtained  they were taken to the operating room on 05/04/2017 and underwent Procedure(s): UNICOMPARTMENTAL KNEE.   They were given perioperative antibiotics:  Anti-infectives    Start     Dose/Rate Route Frequency Ordered Stop   05/04/17 1730  ceFAZolin (ANCEF) IVPB 1 g/50 mL premix     1 g 100 mL/hr over 30 Minutes Intravenous Every 6 hours 05/04/17 1607 05/04/17 2324   05/04/17 0945  ceFAZolin (ANCEF) IVPB 2g/100 mL premix     2 g 200 mL/hr over 30 Minutes Intravenous To ShortStay Surgical 05/01/17 1020 05/04/17 1131    .  Patient given tranexamic acid IV or topical and exparel intra-operatively.  Tolerated  the procedure well.    POD# 1: Vital signs were stable.  Patient denied Chest pain, shortness of breath, or calf pain.  Patient was started on Lovenox 30 mg subcutaneously twice daily at 8am.  Consults to PT, OT, and care management were made.  The patient was weight bearing as tolerated.  CPM was placed on the operative leg 0-90 degrees for 6-8 hours a day. When out of the CPM, patient was placed in the foam block to achieve full extension. Incentive spirometry was taught.  Dressing was changed.       POD #2, Continued  PT for ambulation and exercise program.  IV saline locked.  O2 discontinued.    The remainder of the hospital course was dedicated to ambulation and strengthening.   The patient was discharged on 1 Day Post-Op in  Good condition.  Blood products given:none  DIAGNOSTIC STUDIES: Recent vital signs: Patient Vitals for the past 24 hrs:  BP Temp Temp src Pulse Resp SpO2  05/05/17 0424 130/81 97.5 F (36.4 C) Oral 64 18 98 %  05/04/17 2021 (!) 149/85 98 F (36.7 C) Oral 69 18 95 %  05/04/17 1613 (!) 150/92 97.9 F (36.6 C) Oral 66 15 100 %  05/04/17 1550 (!) 144/82 - - 63 14 95 %  05/04/17 1535 (!) 154/90 - - 64 14 95 %  05/04/17 1520 (!) 149/93 - - 62 13 99 %  05/04/17 1505 (!) 128/98 97.5 F (36.4 C) - 65 13 96 %  05/04/17 1450 (!) 145/87 - - 63 12 98 %  05/04/17 1435 (!) 144/85 - - (!) 112 13 97 %  05/04/17 1420 139/83 - - 60 14 97 %  05/04/17 1405 (!) 142/83 - - 66 14 98 %  05/04/17 1350 135/85 - - 75 13 99 %  05/04/17 1335 127/77 97.3 F (36.3 C) - 72 14 95 %  05/04/17 1112 (!) 116/57 - - (!) 55 11 99 %  05/04/17 1105 (!) 146/72 - - (!) 50 10 98 %  05/04/17 1100 (!) 185/85 - - (!) 50 14 98 %  05/04/17 0814 138/74 - - - - -  05/04/17 0809 - - - - 20 -  05/04/17 0808 (!) 183/90 98.2 F (36.8 C) Oral (!) 57 (!) 0 96 %       Recent laboratory studies:  Recent Labs  04/28/17 0928  WBC 8.3  HGB 11.8*  HCT 38.1*  PLT 207    Recent Labs  04/28/17 0928   NA 138  K 4.0  CL 105  CO2 25  BUN 29*  CREATININE 1.31*  GLUCOSE 113*  CALCIUM 9.0   Lab Results  Component Value Date   INR 0.93 05/04/2017     Recent Radiographic Studies :  No results found.  DISCHARGE INSTRUCTIONS: Discharge Instructions    CPM    Complete by:  As directed    Continuous passive motion machine (CPM):      Use the CPM from 0 to 90 for 4-6 hours per day.      You may increase by 10 per day.  You may break it up into 2 or 3 sessions per day.      Use CPM for 2 weeks or until you are told to stop.   Call MD / Call 911    Complete by:  As directed    If you experience chest pain or shortness of breath, CALL 911 and be transported to the hospital emergency room.  If you develope a fever above 101 F, pus (white drainage) or increased drainage or redness at the wound, or calf pain, call your surgeon's office.   Constipation Prevention    Complete by:  As directed    Drink plenty of fluids.  Prune juice may be helpful.  You may use a stool softener, such as Colace (over the counter) 100 mg twice a day.  Use MiraLax (over the counter) for constipation as needed.   Diet - low sodium heart healthy    Complete by:  As directed    Discharge instructions    Complete by:  As directed    INSTRUCTIONS AFTER JOINT REPLACEMENT   Remove items at home which could result in a fall. This includes throw rugs or furniture in walking pathways ICE to the affected joint every three hours while awake for 30 minutes at a time, for at least the first 3-5 days, and then as needed for pain and swelling.  Continue to use ice for pain and swelling. You may notice swelling that will progress down to the foot and ankle.  This is normal after surgery.  Elevate your leg when you are not up walking on it.   Continue  to use the breathing machine you got in the hospital (incentive spirometer) which will help keep your temperature down.  It is common for your temperature to cycle up and down  following surgery, especially at night when you are not up moving around and exerting yourself.  The breathing machine keeps your lungs expanded and your temperature down.   DIET:  As you were doing prior to hospitalization, we recommend a well-balanced diet.  DRESSING / WOUND CARE / SHOWERING  Keep the surgical dressing until follow up.  The dressing is water proof, so you can shower without any extra covering.  IF THE DRESSING FALLS OFF or the wound gets wet inside, change the dressing with sterile gauze.  Please use good hand washing techniques before changing the dressing.  Do not use any lotions or creams on the incision until instructed by your surgeon.    ACTIVITY  Increase activity slowly as tolerated, but follow the weight bearing instructions below.   No driving for 6 weeks or until further direction given by your physician.  You cannot drive while taking narcotics.  No lifting or carrying greater than 10 lbs. until further directed by your surgeon. Avoid periods of inactivity such as sitting longer than an hour when not asleep. This helps prevent blood clots.  You may return to work once you are authorized by your doctor.     WEIGHT BEARING   Weight bearing as tolerated with assist device (walker, cane, etc) as directed, use it as long as suggested by your surgeon or therapist, typically at least 4-6 weeks.   EXERCISES  Results after joint replacement surgery are often greatly improved when you follow the exercise, range of motion and muscle strengthening exercises prescribed by your doctor. Safety measures are also important to protect the joint from further injury. Any time any of these exercises cause you to have increased pain or swelling, decrease what you are doing until you are comfortable again and then slowly increase them. If you have problems or questions, call your caregiver or physical therapist for advice.   Rehabilitation is important following a joint  replacement. After just a few days of immobilization, the muscles of the leg can become weakened and shrink (atrophy).  These exercises are designed to build up the tone and strength of the thigh and leg muscles and to improve motion. Often times heat used for twenty to thirty minutes before working out will loosen up your tissues and help with improving the range of motion but do not use heat for the first two weeks following surgery (sometimes heat can increase post-operative swelling).   These exercises can be done on a training (exercise) mat, on the floor, on a table or on a bed. Use whatever works the best and is most comfortable for you.    Use music or television while you are exercising so that the exercises are a pleasant break in your day. This will make your life better with the exercises acting as a break in your routine that you can look forward to.   Perform all exercises about fifteen times, three times per day or as directed.  You should exercise both the operative leg and the other leg as well.   Exercises include:   Quad Sets - Tighten up the muscle on the front of the thigh (Quad) and hold for 5-10 seconds.   Straight Leg Raises - With your knee straight (if you were given a brace, keep it on), lift the  leg to 60 degrees, hold for 3 seconds, and slowly lower the leg.  Perform this exercise against resistance later as your leg gets stronger.  Leg Slides: Lying on your back, slowly slide your foot toward your buttocks, bending your knee up off the floor (only go as far as is comfortable). Then slowly slide your foot back down until your leg is flat on the floor again.  Angel Wings: Lying on your back spread your legs to the side as far apart as you can without causing discomfort.  Hamstring Strength:  Lying on your back, push your heel against the floor with your leg straight by tightening up the muscles of your buttocks.  Repeat, but this time bend your knee to a comfortable angle, and  push your heel against the floor.  You may put a pillow under the heel to make it more comfortable if necessary.   A rehabilitation program following joint replacement surgery can speed recovery and prevent re-injury in the future due to weakened muscles. Contact your doctor or a physical therapist for more information on knee rehabilitation.    CONSTIPATION  Constipation is defined medically as fewer than three stools per week and severe constipation as less than one stool per week.  Even if you have a regular bowel pattern at home, your normal regimen is likely to be disrupted due to multiple reasons following surgery.  Combination of anesthesia, postoperative narcotics, change in appetite and fluid intake all can affect your bowels.   YOU MUST use at least one of the following options; they are listed in order of increasing strength to get the job done.  They are all available over the counter, and you may need to use some, POSSIBLY even all of these options:    Drink plenty of fluids (prune juice may be helpful) and high fiber foods Colace 100 mg by mouth twice a day  Senokot for constipation as directed and as needed Dulcolax (bisacodyl), take with full glass of water  Miralax (polyethylene glycol) once or twice a day as needed.  If you have tried all these things and are unable to have a bowel movement in the first 3-4 days after surgery call either your surgeon or your primary doctor.    If you experience loose stools or diarrhea, hold the medications until you stool forms back up.  If your symptoms do not get better within 1 week or if they get worse, check with your doctor.  If you experience "the worst abdominal pain ever" or develop nausea or vomiting, please contact the office immediately for further recommendations for treatment.   ITCHING:  If you experience itching with your medications, try taking only a single pain pill, or even half a pain pill at a time.  You can also use  Benadryl over the counter for itching or also to help with sleep.   TED HOSE STOCKINGS:  Use stockings on both legs until for at least 2 weeks or as directed by physician office. They may be removed at night for sleeping.  MEDICATIONS:  See your medication summary on the "After Visit Summary" that nursing will review with you.  You may have some home medications which will be placed on hold until you complete the course of blood thinner medication.  It is important for you to complete the blood thinner medication as prescribed.  PRECAUTIONS:  If you experience chest pain or shortness of breath - call 911 immediately for transfer to the hospital emergency  department.   If you develop a fever greater that 101 F, purulent drainage from wound, increased redness or drainage from wound, foul odor from the wound/dressing, or calf pain - CONTACT YOUR SURGEON.                                                   FOLLOW-UP APPOINTMENTS:  If you do not already have a post-op appointment, please call the office for an appointment to be seen by your surgeon.  Guidelines for how soon to be seen are listed in your "After Visit Summary", but are typically between 1-4 weeks after surgery.  OTHER INSTRUCTIONS:   Knee Replacement:  Do not place pillow under knee, focus on keeping the knee straight while resting. CPM instructions: 0-90 degrees, 2 hours in the morning, 2 hours in the afternoon, and 2 hours in the evening. Place foam block, curve side up under heel at all times except when in CPM or when walking.  DO NOT modify, tear, cut, or change the foam block in any way.  MAKE SURE YOU:  Understand these instructions.  Get help right away if you are not doing well or get worse.    Thank you for letting us be a part of your medical care team.  It is a privilege we respect greatly.  We hope these instructions will help you stay on track for a fast and full recovery!   Increase activity slowly as tolerated     Complete by:  As directed       DISCHARGE MEDICATIONS:   Allergies as of 05/05/2017      Reactions   No Known Allergies       Medication List    TAKE these medications   acetaminophen 500 MG tablet Commonly known as:  TYLENOL Take 1,000 mg by mouth every 8 (eight) hours as needed for mild pain. Reported on 04/23/2016   CALCIUM 1200 PO Take 1 capsule by mouth daily. One daily   cholecalciferol 400 units Tabs tablet Commonly known as:  VITAMIN D Take 800 Units by mouth daily. TAKE 2 TABLETS   DELZICOL 400 MG Cpdr DR capsule Generic drug:  Mesalamine TAKE TWO CAPSULES (800 MG TOTAL) BY MOUTH THREE TIMES DAILY   diphenhydrAMINE 25 mg capsule Commonly known as:  BENADRYL Take 25 mg by mouth at bedtime as needed for sleep.   ELIQUIS 5 MG Tabs tablet Generic drug:  apixaban Take 5 mg by mouth daily.   febuxostat 40 MG tablet Commonly known as:  ULORIC Take 40 mg by mouth daily.   finasteride 5 MG tablet Commonly known as:  PROSCAR Take 5 mg by mouth daily.   Glucosamine Sulfate 500 MG Caps Take 500 mg by mouth 2 (two) times daily.   hydrochlorothiazide 25 MG tablet Commonly known as:  HYDRODIURIL Take 25 mg by mouth daily.   levothyroxine 50 MCG tablet Commonly known as:  SYNTHROID, LEVOTHROID Take 50 mcg by mouth daily before breakfast.   lipase/protease/amylase 36000 UNITS Cpep capsule Commonly known as:  CREON Take two with breakfast, two with lunch, two with supper. What changed:  how much to take  how to take this  when to take this  additional instructions   methocarbamol 500 MG tablet Commonly known as:  ROBAXIN Take 1-2 tablets (500-1,000 mg total) by mouth every 6 (six)  hours as needed for muscle spasms.   metoprolol tartrate 50 MG tablet Commonly known as:  LOPRESSOR Take 50 mg by mouth every 8 (eight) hours.   Oxycodone HCl 10 MG Tabs Take 0.5-1 tablets (5-10 mg total) by mouth every 3 (three) hours as needed for breakthrough pain.    PACERONE 100 MG tablet Generic drug:  amiodarone Take 100 mg by mouth daily.   pantoprazole 40 MG tablet Commonly known as:  PROTONIX Take 40 mg by mouth daily.   predniSONE 10 MG tablet Commonly known as:  DELTASONE Take 42m daily with breakfast for 7 days, then 58mdaily with breakfast. What changed:  how much to take  how to take this  when to take this  additional instructions   predniSONE 1 MG tablet Commonly known as:  DELTASONE Take 4 tablets daily for 10 days, 3 tablets daily for 10 days, 2 tablets daily for 10 days, 1 tablet daily for 10 days then stop. What changed:  how much to take  how to take this  when to take this  additional instructions   tamsulosin 0.4 MG Caps capsule Commonly known as:  FLOMAX Take 0.4 mg by mouth daily after supper.   VITAMIN C PLUS WILD ROSE HIPS PO Take 1,200 mg by mouth daily.            Durable Medical Equipment        Start     Ordered   05/04/17 1607  DME Walker rolling  Once    Question:  Patient needs a walker to treat with the following condition  Answer:  S/P left unicompartmental knee replacement   05/04/17 1607   05/04/17 1607  DME 3 n 1  Once     05/04/17 1607   05/04/17 1607  DME Bedside commode  Once    Question:  Patient needs a bedside commode to treat with the following condition  Answer:  S/P left unicompartmental knee replacement   05/04/17 1607      FOLLOW UP VISIT:    DISPOSITION: HOME VS. SNF  CONDITION:  Good   CoDonia Ast/26/2018, 7:25 AM

## 2017-05-05 NOTE — Anesthesia Postprocedure Evaluation (Signed)
Anesthesia Post Note  Patient: Louis Bellanca Kreuzer Jr.  Procedure(s) Performed: Procedure(s) (LRB): UNICOMPARTMENTAL KNEE (Left)     Patient location during evaluation: PACU Anesthesia Type: Regional and General Level of consciousness: awake and alert Pain management: pain level controlled Vital Signs Assessment: post-procedure vital signs reviewed and stable Respiratory status: spontaneous breathing, nonlabored ventilation, respiratory function stable and patient connected to nasal cannula oxygen Cardiovascular status: blood pressure returned to baseline and stable Postop Assessment: no signs of nausea or vomiting Anesthetic complications: no    Last Vitals:  Vitals:   05/04/17 2021 05/05/17 0424  BP: (!) 149/85 130/81  Pulse: 69 64  Resp: 18 18  Temp: 36.7 C 36.4 C    Last Pain:  Vitals:   05/05/17 0605  TempSrc:   PainSc: Asleep                 Toussaint Golson

## 2017-05-05 NOTE — Progress Notes (Signed)
Discharge instructions and RX reviewed with pt and wife berb understanding. Pt left with all belongings in hand in NAD via wheelchair.

## 2017-05-05 NOTE — Progress Notes (Signed)
Pyxis discrepancy: RN withdrew scheduled vicodin 5/325 from pyxis and pt did not require the dose. When RN went to return 2 tablets that were originally pulled, a discrepancy count was created. RN then realized she had accidentally pulled out 2 tabs instead of 1 tab ordered. RN informed charge nurse, Andi Hence. Discrepancy was cleared in pyxis, but Rn was only allowed to return 1 tab versus the 2 that were actually pulled. Main pharmacy was notified and was told to bring the extra dose physically to main pharmacy. This RN returned 1 tab in pyxis and 1 tab to main pharmacy tech, Nicole Cella.

## 2017-05-06 ENCOUNTER — Encounter (HOSPITAL_COMMUNITY): Payer: Self-pay | Admitting: Orthopedic Surgery

## 2017-06-19 ENCOUNTER — Ambulatory Visit: Payer: Medicare Other | Admitting: Gastroenterology

## 2017-07-09 ENCOUNTER — Ambulatory Visit (INDEPENDENT_AMBULATORY_CARE_PROVIDER_SITE_OTHER): Payer: Medicare Other | Admitting: Gastroenterology

## 2017-07-09 ENCOUNTER — Encounter: Payer: Self-pay | Admitting: Gastroenterology

## 2017-07-09 ENCOUNTER — Other Ambulatory Visit: Payer: Self-pay | Admitting: Gastroenterology

## 2017-07-09 DIAGNOSIS — K861 Other chronic pancreatitis: Secondary | ICD-10-CM | POA: Diagnosis not present

## 2017-07-09 DIAGNOSIS — K5100 Ulcerative (chronic) pancolitis without complications: Secondary | ICD-10-CM

## 2017-07-09 LAB — CBC WITH DIFFERENTIAL/PLATELET
BASOS PCT: 0 %
Basophils Absolute: 0 cells/uL (ref 0–200)
EOS PCT: 1 %
Eosinophils Absolute: 71 cells/uL (ref 15–500)
HCT: 35 % — ABNORMAL LOW (ref 38.5–50.0)
Hemoglobin: 11.2 g/dL — ABNORMAL LOW (ref 13.2–17.1)
LYMPHS PCT: 16 %
Lymphs Abs: 1136 cells/uL (ref 850–3900)
MCH: 28.9 pg (ref 27.0–33.0)
MCHC: 32 g/dL (ref 32.0–36.0)
MCV: 90.2 fL (ref 80.0–100.0)
MPV: 9.3 fL (ref 7.5–12.5)
Monocytes Absolute: 497 cells/uL (ref 200–950)
Monocytes Relative: 7 %
NEUTROS PCT: 76 %
Neutro Abs: 5396 cells/uL (ref 1500–7800)
PLATELETS: 207 10*3/uL (ref 140–400)
RBC: 3.88 MIL/uL — AB (ref 4.20–5.80)
RDW: 15.2 % — AB (ref 11.0–15.0)
WBC: 7.1 10*3/uL (ref 3.8–10.8)

## 2017-07-09 LAB — HEPATITIS B CORE ANTIBODY, TOTAL: Hep B Core Total Ab: NONREACTIVE

## 2017-07-09 LAB — HEPATITIS B SURFACE ANTIBODY,QUALITATIVE: HEP B S AB: NONREACTIVE

## 2017-07-09 MED ORDER — PREDNISONE 5 MG PO TABS: 2.5000 mg | ORAL_TABLET | Freq: Every day | ORAL | 6 refills | Status: DC

## 2017-07-09 NOTE — Patient Instructions (Addendum)
Call me one week after Louis Bowers.  OPTIONS FOR TREATMENT FOR ULCERATIVE COLITIS: 1. IMURAN OR 2. HUMIRA.  IMURAN CAN CAUSE INFLAMMATION OF THE PANCREAS AND LIVER, NAUSEA/VOMITING,SUSCEPTIBILITY TO INFECTION., AND LOW WHITE COUNT. HUMIRA CAN CAUSE SUSCEPTIBILITY TO INFECTION.  IF WE START IMURAN YOU WILL GET LABS DRAWN  ONE WEEK LATER THEN 3 WEEKS LATER THEN 4 WEEKS LATER THEN EVERY 3 MOS FOR ONE YEAR.  I WILL CALL IN PRESCRIPTION FOR IMURAN AFTER YOUR LABS ARE COMPLETE. SEE INFORMATION ABOUT IMURAN BELOW.    CONTINUE PREDNISONE AT 2.5 MG DAILY AND DELZICOL.  FOLLOW UP IN 3 MOS.     Azathioprine tablets What is this medicine? AZATHIOPRINE (ay za THYE oh preen) suppresses the immune system. It is used to prevent organ rejection after a transplant. It is also used to treat rheumatoid arthritis. This medicine may be used for other purposes; ask your health care provider or pharmacist if you have questions.  How should I use this medicine? Take this medicine by mouth with a full glass of water. Follow the directions on the prescription label. Take your medicine at regular intervals. Do not take your medicine more often than directed. Continue to take your medicine even if you feel better. Do not stop taking except on your doctor's advice. Talk to your pediatrician regarding the use of this medicine in children. Special care may be needed. Overdosage: If you think you have taken too much of this medicine contact a poison control center or emergency room at once. NOTE: This medicine is only for you. Do not share this medicine with others.  What may interact with this medicine? Do not take this medicine with any of the following medications: -mercaptopurine This medicine may also interact with the following medications: -allopurinol -aminosalicylates like sulfasalazine, mesalamine, balsalazide, and olsalazine -leflunomide -medicines called ACE inhibitors like benazepril,  captopril, enalapril, fosinopril, quinapril, lisinopril, ramipril, and trandolapril -mycophenolate -sulfamethoxazole; trimethoprim -vaccines -warfarin This list may not describe all possible interactions. Give your health care provider a list of all the medicines, herbs, non-prescription drugs, or dietary supplements you use. Also tell them if you smoke, drink alcohol, or use illegal drugs. Some items may interact with your medicine.  What should I watch for while using this medicine? Visit your doctor or health care professional for regular checks on your progress. You will need frequent blood checks during the first few months you are receiving the medicine. If you get a cold or other infection while receiving this medicine, call your doctor or health care professional. Do not treat yourself. The medicine may increase your risk of getting an infection. Women should inform their doctor if they wish to become pregnant or think they might be pregnant. There is a potential for serious side effects to an unborn child. Talk to your health care professional or pharmacist for more information. Men may have a reduced sperm count while they are taking this medicine. Talk to your health care professional for more information. This medicine may increase your risk of getting certain kinds of cancer. Talk to your doctor about healthy lifestyle choices, important screenings, and your risk.  What side effects may I notice from receiving this medicine? Side effects that you should report to your doctor or health care professional as soon as possible: -allergic reactions like skin rash, itching or hives, swelling of the face, lips, or tongue -fever, chills, or any other sign of infection -severe stomach pain -unusual bleeding, bruising -unusually weak or tired -  vomiting -yellowing of the eyes or skin Side effects that usually do not require medical attention (report to your doctor or health care professional  if they continue or are bothersome): -hair loss -nausea This list may not describe all possible side effects. Call your doctor for medical advice about side effects. You may report side effects to FDA at 1-800-FDA-1088.  Where should I keep my medicine? Keep out of the reach of children. Store at room temperature between 15 and 25 degrees C (59 and 77 degrees F). Protect from light. Throw away any unused medicine after the expiration date. NOTE: This sheet is a summary. It may not cover all possible information. If you have questions about this medicine, talk to your doctor, pharmacist, or health care provider.  2013, [employer]. (01/19/2008 10:28:43 AM)

## 2017-07-09 NOTE — Addendum Note (Signed)
Addended by: Danie Binder on: 07/09/2017 11:19 AM   Modules accepted: Orders

## 2017-07-09 NOTE — Progress Notes (Signed)
cc'ed to pcp °

## 2017-07-09 NOTE — Progress Notes (Signed)
On recall  °

## 2017-07-09 NOTE — Progress Notes (Signed)
Subjective:    Patient ID: Louis Robert., male    DOB: 03-Dec-1934, 82 y.o.   MRN: [id number]   Andres Shad, MD  HPI Haven't been able to get it squared aware. WORKING WITH [facility] [employer] TO GET CO-PAY COVERED $700/MO. PREDNISONE 2.5 MG HELPS SOME BUT 5 MG TAKES CARE OF ARTHRITIS. NEVER TRIED ON IMURAN OR 6-MP. HEALS FAST. ON ELIQUIS. BMs: EVERY DAY. HAS QUESTIONS ABOUT HUMIRA V. IMURAN. V. PREDNISONE.  PT DENIES FEVER, CHILLS, HEMATOCHEZIA, HEMATEMESIS, nausea, vomiting, melena, diarrhea, CHEST PAIN, SHORTNESS OF BREATH,  CHANGE IN BOWEL IN HABITS, constipation, abdominal pain, problems swallowing, OR heartburn or indigestion.  Past Medical History:  Diagnosis Date  . A-fib ([place])    Dr. Rosalita Chessman  . Bladder cancer ([place]) 2004   instilled chemo  . Chronic renal insufficiency, stage III (moderate)    [facility]  . Hypertension   . Hypothyroidism   . Osteoarthritis   . Pancreas divisum, native   . Pancreatitis    Dr.  Oliva Bustard. Shifflett  . UC (ulcerative colitis) ([place])    Dr. Spainhour/Dr. Shifflett: diagnosed 11/2011 involved entire colon    Past Surgical History:  Procedure Laterality Date  . BACK SURGERY  05/2003  . BACK SURGERY  06/2014  . BACK SURGERY  08/2015  . BLADDER SURGERY  02/2009  . CARDIAC CATHETERIZATION    . CARDIOVASCULAR STRESS TEST    . COLONOSCOPY N/A 2005 [place]   Dr. Nathaneil Canary Shiflett: 5 polyps removed from cecal area all were benign  . COLONOSCOPY  05/2011   Dr. Nathaneil Canary Shiflett:no polyps, diverticulosis  . ESOPHAGOGASTRODUODENOSCOPY  2005 [place]   Dr. Nathaneil Canary Shiflett: normal except for antral gastritis  . EUS  09/2011   [facility]: chronic pancreatitis vs chronic smodering pancreatitis  . FLEXIBLE SIGMOIDOSCOPY  02/2012   Dr. Nathaneil Canary Shiflett: advance up to 60cm, entire left colon with intense UC flare, no bx as patient was on coumdin  . FLEXIBLE SIGMOIDOSCOPY  11/2011   Dr. Nathaneil Canary Shiflett: diffuse colitis universal,  cecal polyp removed (adenoma)  . KNEE SURGERY Left 05/04/2017   partial  . PARTIAL KNEE ARTHROPLASTY Left 05/04/2017   Procedure: UNICOMPARTMENTAL KNEE;  Surgeon: Vickey Huger, MD;  Location: [place];  Service: Orthopedics;  Laterality: Left;  . REPLACEMENT TOTAL KNEE Right   . US ECHOCARDIOGRAPHY     Allergies  Allergen Reactions  . No Known Allergies    Current Outpatient Prescriptions  Medication Sig Dispense Refill  . acetaminophen  500 MG tablet Take 1,000 mg by mouth every 8 (eight) hours as needed for mild pain.     . Ascorbic Acid (VITAMIN C PLUS  Take 1,200 mg daily.    . Calcium Carbonate-Vit D-Min  1200 MG One daily    . VITAMIN D 400 units TABS tablet . TAKE 2 TABLETS    . DELZICOL 400 MG  capsule 800 MG TID    . diphenhydrAMINE 25 mg capsule Take 25 mg by mouth at bedtime as needed for sleep.     Marland Kitchen ELIQUIS 5 MG TABS tablet Take 5 mg by mouth daily.     . febuxostat (ULORIC) 40 MG tablet Take 40 mg by mouth daily.    . finasteride (PROSCAR) 5 MG tablet Take 5 mg by mouth daily.     . Glucosamine Sulfate 500 MG CAPS Take 500 mg by mouth 2 (two) times daily.     . hydrochlorothiazide 25 MG tablet Take 25 mg by mouth daily.     Marland Kitchen  SYNTHROID 50 MCG tablet Take 50 mcg QAM     . lipase/protease/amylase (CREON) 36000 UNITS CPEP capsule Take two with breakfast, two with lunch, two with supper. (Patient taking differently: Take 72,000 Units by mouth 3 (three) times daily with meals. Take two tablets three times a day)    . metoprolol 50 MG tablet Take 50 mg Q8H    . PACERONE 100 MG tablet Take 100 mg  daily.     . pantoprazole  40 MG tablet Take 40 mg  daily.     . predniSONE 5 MG tablet Take 2.5 mg by mouth daily.    . tamsulosin  0.4 MG CAPS  Take 0.4 mg QPM    .      .      .      .       Review of Systems PER HPI OTHERWISE ALL SYSTEMS ARE NEGATIVE.    Objective:   Physical Exam  Constitutional: He is oriented to person, place, and time. He appears well-developed and  well-nourished. No distress.  HENT:  Head: Normocephalic and atraumatic.  Mouth/Throat: Oropharynx is clear and moist. No oropharyngeal exudate.  Eyes: Pupils are equal, round, and reactive to light. No scleral icterus.  Neck: Normal range of motion. Neck supple.  Cardiovascular: Normal rate, regular rhythm and normal heart sounds.   Pulmonary/Chest: Effort normal and breath sounds normal. No respiratory distress.  Abdominal: Soft. Bowel sounds are normal. He exhibits no distension. There is no tenderness.  Musculoskeletal: He exhibits no edema.  Lymphadenopathy:    He has no cervical adenopathy.  Neurological: He is alert and oriented to person, place, and time.  NO  NEW FOCAL DEFICITS  Psychiatric: He has a normal mood and affect.  Vitals reviewed.     Assessment & Plan:

## 2017-07-09 NOTE — Assessment & Plan Note (Addendum)
WELL COMPENSATED DISEASE ON PREDNISONE AND DELZICOL. WOULD LIKE TO TRANSITION TO A STEROID FREE REGIMEN.  Call one week after LABS ARE DRAWN. OPTIONS FOR TREATMENT FOR ULCERATIVE COLITIS: 1. IMURAN OR 2. HUMIRA. IMURAN CAN CAUSE INFLAMMATION OF THE PANCREAS AND LIVER, NAUSEA/VOMTING, AND LOW WHITE COUNT. HUMIRA CAN CAUSE SUSCEPTIBILITY TO INFECTION. IF WE START IMURAN YOU WILL GET LABS DRAWN  ONE WEEK LATER THEN 3 WEEKS LATER THEN 4 WEEKS LATER THEN EVERY 3 MOS FOR ONE YEAR. I WILL CALL IN PRESCRIPTION FOR IMURAN AFTER YOUR LABS ARE COMPLETE. SEE INFORMATION ABOUT IMURAN BELOW.   CONTINUE PREDNISONE AT 2.5 MG DAILY AND DELZICOL. PLAN TO STOP DELZICOL AND START TO TAPER PREDNISONE 3 MOS AFTER STARTING UMRUAN.  FOLLOW UP IN 3 MOS.   GREATER THAN 50% WAS SPENT IN COUNSELING & COORDINATION OF CARE WITH THE PATIENT: DISCUSSED DIFFERENTIAL DIAGNOSIS,  BENEFITS, RISKS, AND MANAGEMENT OF ULCERATIVE COLITIS. TOTAL ENCOUNTER TIME: 40 MINS.

## 2017-07-09 NOTE — Assessment & Plan Note (Signed)
SYMPTOMS CONTROLLED/RESOLVED.  CONTINUE CREON. CONTINUE TO MONITOR SYMPTOMS.

## 2017-07-10 LAB — QUANTIFERON TB GOLD ASSAY (BLOOD)
Interferon Gamma Release Assay: NEGATIVE
Mitogen-Nil: >10 IU/mL
Quantiferon Nil Value: 0.04 IU/mL
Quantiferon Tb Ag Minus Nil Value: <0 IU/mL

## 2017-07-13 ENCOUNTER — Telehealth: Payer: Self-pay | Admitting: Gastroenterology

## 2017-07-13 DIAGNOSIS — Z9189 Other specified personal risk factors, not elsewhere classified: Secondary | ICD-10-CM

## 2017-07-13 NOTE — Telephone Encounter (Addendum)
PLEASE CALL PT. HIS TB TEST IS NEGATIVE. HE HAS NEVER BEEN EXPOSED TO ACUTE HEPATITIS B. ONE LAB IS SPENDING. HE NEEDS TWO MORE LABS TO MAKE SURE HE DOES NOT HAVE CHRONIC HEPATITIS B.

## 2017-07-14 ENCOUNTER — Other Ambulatory Visit: Payer: Self-pay | Admitting: Gastroenterology

## 2017-07-14 NOTE — Telephone Encounter (Signed)
Pt is aware and orders have been faxed to [facility] since he will be going there this afternoon.

## 2017-07-14 NOTE — Telephone Encounter (Signed)
Dr. Oneida Alar, I received a phone call from [facility] and they did not have a requisition for the [facility] lab ordered. I told them that pt brought it with him, we had filled it out. I called pt and it seems he gave them all the paperwork. However, I failed to make a copy of that lab. I am faxing the paperwork to you at the hospital. Please choose which test you ordered and fax back to me. I have to fax back to [facility].  Thanks!

## 2017-07-15 LAB — HEPATITIS B CORE ANTIBODY, IGM: Hep B C IgM: NONREACTIVE

## 2017-07-15 LAB — HEPATITIS B SURFACE ANTIGEN: Hepatitis B Surface Antigen: NONREACTIVE

## 2017-07-15 LAB — THIOPURINE METHYLTRANSFERASE (TPMT), RBC: THIOPURINE METHYLTRANSFERASE, RBC: 20 nmol/h/mL

## 2017-07-15 NOTE — Telephone Encounter (Signed)
Results received from [facility] today and placed on Dr. Nona Dell desk.

## 2017-07-15 NOTE — Telephone Encounter (Signed)
Pt is aware. I am mailing the order for the Hep A and B vaccines to pt.  He said that he saw his Rheumatologist this morning and he would prefer him to take the Humira, and was against the Imuran.  Pt said he is not sure why the Rheumatologist was against the Imuran.  He said he does not feel that he wants to pay for the Humira unless something got worked out for it to be less expensive.   He wants to call his insurance co again and speak with them about it.  He said if Dr. Oneida Alar thinks that he should be on the Imuran, he will just do that.

## 2017-07-15 NOTE — Telephone Encounter (Signed)
LM for pt to call

## 2017-07-15 NOTE — Telephone Encounter (Addendum)
PLEASE CALL PT. HE HAS NEVER HAD HEP B. HE SHOULD GET THE HEP A/B VACCINE. HIS ENZYME ACTIVITY IS NORMAL. HE SHOULD CONTACT ME WHEN HE IS READY TO START IMURAN. ALL HIS TESTS ARE BACK.

## 2017-07-16 ENCOUNTER — Other Ambulatory Visit: Payer: Self-pay

## 2017-07-16 DIAGNOSIS — K51919 Ulcerative colitis, unspecified with unspecified complications: Secondary | ICD-10-CM

## 2017-07-16 LAB — PROMETHEUS-MAIL

## 2017-07-16 NOTE — Telephone Encounter (Signed)
Pt said he called [employer] and spoke to Dora. He said that they were waiting on the prescribers information. I told him that I would let [name] know, since she said it was already approved.

## 2017-07-16 NOTE — Telephone Encounter (Signed)
PLEASE CALL PT. I SPOKE TO DR. SCHROFF. WE AGREE HUMIRA WILL BE BEST FOR UC AND RA. HIS CO-PAY IS COVERED. HE SHOULD LET us KNOW WHEN HE RECEIVES THE MEDS AND IF HE NEEDS INSTRUCTION ON HOW TO GIVE HIMSELF THE SHOT.  CONTINUE PREDNISONE AT 2.5 MG DAILY AND DELZICOL. PLAN TO STOP DELZICOL AND START TO TAPER PREDNISONE 6 WEEKS AFTER STARTING HUMIRA.  FOLLOW UP IN NOV 2018. NEED CBC/CMP 1 WEEK PRIOR TO OPV.  PLEASE CALL WITH QUESTIONS OR CONCERNS.

## 2017-07-16 NOTE — Telephone Encounter (Signed)
The prescriber information has been faxed to them twice. I have faxed it again.

## 2017-07-16 NOTE — Telephone Encounter (Signed)
Pt is aware of everything. He said he has a sister and granddaughter that are RN's and can help him with the Humira injection when time for it.  He is aware [name] has refaxed the information.   Lab orders on file for early Nov 2018.   Forwarding to [place] to schedule the follow up appt in November.

## 2017-07-16 NOTE — Telephone Encounter (Signed)
LMOM for a return call. Also, per [name], pt was approved for his patient assistance for the Humira. He needs to call the [employer] at [phone].

## 2017-07-16 NOTE — Telephone Encounter (Signed)
Pt's Rheumatologist is Dr. Scarlette Shorts in [place]. [phone]. He will call the number in reference to the Humira.

## 2017-07-16 NOTE — Telephone Encounter (Signed)
PLEASE CALL PT. I NEED THE NAME OF HIS RHEUMATOLOGIST. WE CAN DISCUSS. EITHER IMURAN OR HUMIRA WILL WORK FOR HIS ULCERATIVE COLITIS.

## 2017-07-16 NOTE — Telephone Encounter (Signed)
REVIEWED-NO ADDITIONAL RECOMMENDATIONS. 

## 2017-07-17 NOTE — Telephone Encounter (Signed)
cc'ed to Dr Teryl Lucy and Dr Scarlette Shorts, reminder in epic

## 2017-08-04 ENCOUNTER — Telehealth: Payer: Self-pay | Admitting: Gastroenterology

## 2017-08-04 ENCOUNTER — Other Ambulatory Visit: Payer: Self-pay

## 2017-08-04 DIAGNOSIS — K51919 Ulcerative colitis, unspecified with unspecified complications: Secondary | ICD-10-CM

## 2017-08-04 NOTE — Telephone Encounter (Signed)
Pt called to let us know that his Humira arrived today and he has questions about tapering off his prednisone. Please advise and call him back at [phone]

## 2017-08-04 NOTE — Telephone Encounter (Signed)
Forwarding the note to Neil Crouch, PA and Dr. Oneida Alar.

## 2017-08-04 NOTE — Telephone Encounter (Signed)
Pt said the nurse from [facility] is coming tomorrow to show him how to give the injections. They have been in touch with him regularly. He is taking 2.5 mg prednisone daily and just needs to know how to taper. I told him that Neil Crouch PA had to leave early today, and she told me that she will address this tomorrow, and I will call and let him know.

## 2017-08-05 ENCOUNTER — Other Ambulatory Visit: Payer: Self-pay | Admitting: Gastroenterology

## 2017-08-05 NOTE — Telephone Encounter (Signed)
PLEASE CALL PT. He can stop Delzicol after he finishes his loading doses of his Humira.

## 2017-08-05 NOTE — Telephone Encounter (Signed)
Pt is aware and wants to know when does he stop the Delzicol?

## 2017-08-05 NOTE — Telephone Encounter (Signed)
PLEASE CALL PT. HE SHOULD CONTINUE ON CURRENT DOSE OR PREDNISONE FOR 3 MOS THEN WE WILL START TO TAPER THE PREDNISONE.

## 2017-08-06 NOTE — Telephone Encounter (Signed)
PT is aware.

## 2017-08-12 ENCOUNTER — Encounter: Payer: Self-pay | Admitting: Gastroenterology

## 2017-08-31 LAB — COMPREHENSIVE METABOLIC PANEL
AG Ratio: 1.6 (calc) (ref 1.0–2.5)
ALBUMIN MSPROF: 4 g/dL (ref 3.6–5.1)
ALT: 11 U/L (ref 9–46)
AST: 15 U/L (ref 10–35)
Alkaline phosphatase (APISO): 67 U/L (ref 40–115)
BUN/Creatinine Ratio: 17 (calc) (ref 6–22)
BUN: 25 mg/dL (ref 7–25)
CALCIUM: 9.2 mg/dL (ref 8.6–10.3)
CO2: 30 mmol/L (ref 20–32)
Chloride: 104 mmol/L (ref 98–110)
Creat: 1.51 mg/dL — ABNORMAL HIGH (ref 0.70–1.11)
GLUCOSE: 106 mg/dL (ref 65–139)
Globulin: 2.5 g/dL (calc) (ref 1.9–3.7)
POTASSIUM: 4.3 mmol/L (ref 3.5–5.3)
SODIUM: 140 mmol/L (ref 135–146)
TOTAL PROTEIN: 6.5 g/dL (ref 6.1–8.1)
Total Bilirubin: 0.3 mg/dL (ref 0.2–1.2)

## 2017-08-31 LAB — CBC WITH DIFFERENTIAL/PLATELET
BASOS ABS: 17 {cells}/uL (ref 0–200)
Basophils Relative: 0.3 %
EOS PCT: 1.7 %
Eosinophils Absolute: 99 cells/uL (ref 15–500)
HCT: 34.1 % — ABNORMAL LOW (ref 38.5–50.0)
HEMOGLOBIN: 10.9 g/dL — AB (ref 13.2–17.1)
Lymphs Abs: 1212 cells/uL (ref 850–3900)
MCH: 28.5 pg (ref 27.0–33.0)
MCHC: 32 g/dL (ref 32.0–36.0)
MCV: 89.3 fL (ref 80.0–100.0)
MONOS PCT: 8.2 %
MPV: 9.9 fL (ref 7.5–12.5)
NEUTROS ABS: 3996 {cells}/uL (ref 1500–7800)
Neutrophils Relative %: 68.9 %
Platelets: 210 10*3/uL (ref 140–400)
RBC: 3.82 10*6/uL — AB (ref 4.20–5.80)
RDW: 14.7 % (ref 11.0–15.0)
Total Lymphocyte: 20.9 %
WBC mixed population: 476 cells/uL (ref 200–950)
WBC: 5.8 10*3/uL (ref 3.8–10.8)

## 2017-09-17 NOTE — Progress Notes (Signed)
PT is aware.

## 2017-10-07 ENCOUNTER — Ambulatory Visit: Payer: Medicare Other | Admitting: Gastroenterology

## 2017-10-28 ENCOUNTER — Telehealth: Payer: Self-pay | Admitting: Gastroenterology

## 2017-10-28 NOTE — Telephone Encounter (Signed)
Patient called and stated that humara needs to be called because he is due for his refill      [phone]

## 2017-10-28 NOTE — Telephone Encounter (Signed)
Pt thinks he might have gotten paperwork in mail and mailed it back. He will see if he made a copy and bring it by tomorrow morning. Also, he is aware to bring copies of his income to be able to complete paperwork here.

## 2017-10-29 NOTE — Telephone Encounter (Signed)
Pt came by the office and completed the paperwork and brought copies of income. Rx signed by Roseanne Kaufman, NP and I have faxed to [employer].

## 2017-10-29 NOTE — Telephone Encounter (Signed)
I am emailing Lindie Spruce, drug rep, to see if we can get a sample so pt will not run out due to the holidays coming up.

## 2017-11-06 NOTE — Telephone Encounter (Signed)
Received approval from [place], pt is approved for patient Assistance for the Humira. I left vm for pt and asked him to contact [employer] person to assist with making sure he gets it on time. I have not heard back from drug rep. Left Vm to call with questions.

## 2017-11-11 NOTE — Telephone Encounter (Signed)
Pt called and said he should be receiving his medication on 11/24/2017 and if so he has enough til then.

## 2017-11-26 ENCOUNTER — Other Ambulatory Visit: Payer: Self-pay

## 2017-11-26 ENCOUNTER — Ambulatory Visit (INDEPENDENT_AMBULATORY_CARE_PROVIDER_SITE_OTHER): Payer: Medicare Other | Admitting: Gastroenterology

## 2017-11-26 ENCOUNTER — Encounter: Payer: Self-pay | Admitting: Gastroenterology

## 2017-11-26 DIAGNOSIS — M8580 Other specified disorders of bone density and structure, unspecified site: Secondary | ICD-10-CM

## 2017-11-26 DIAGNOSIS — K5100 Ulcerative (chronic) pancolitis without complications: Secondary | ICD-10-CM | POA: Diagnosis not present

## 2017-11-26 DIAGNOSIS — D649 Anemia, unspecified: Secondary | ICD-10-CM | POA: Diagnosis not present

## 2017-11-26 DIAGNOSIS — Z7952 Long term (current) use of systemic steroids: Secondary | ICD-10-CM | POA: Insufficient documentation

## 2017-11-26 DIAGNOSIS — K51919 Ulcerative colitis, unspecified with unspecified complications: Secondary | ICD-10-CM

## 2017-11-26 DIAGNOSIS — K912 Postsurgical malabsorption, not elsewhere classified: Secondary | ICD-10-CM

## 2017-11-26 NOTE — Progress Notes (Signed)
Subjective:    Patient ID: Louis Bowers., male    DOB: 1935-01-18, 83 y.o.   MRN: [id number]  Andres Shad, MD   HPI First dose started@ DEC 2018. OFF DELZICOL. On PREDNISONE 1 MG DAILY. BMs: EVERYTHING IS GOOD. NOT NOTICED ANY DIFFERENCE SO FAR. WALKED AROUND [id number] AND DIDN'T BOTHER HIM.  PT DENIES FEVER, CHILLS, HEMATOCHEZIA, HEMATEMESIS, nausea, vomiting, melena, diarrhea, CHEST PAIN, SHORTNESS OF BREATH, CHANGE IN BOWEL IN HABITS, constipation, abdominal pain, problems swallowing, OR heartburn or indigestion.  Past Medical History:  Diagnosis Date  . A-fib ([place])    Dr. Rosalita Chessman  . Bladder cancer ([place]) 2004   instilled chemo  . Chronic renal insufficiency, stage III (moderate) ([place])    [facility]  . Hypertension   . Hypothyroidism   . Osteoarthritis   . Pancreas divisum, native   . Pancreatitis    Dr.  Oliva Bustard. Shifflett  . UC (ulcerative colitis) ([place])    Dr. Spainhour/Dr. Shifflett: diagnosed 11/2011 involved entire colon   Past Surgical History:  Procedure Laterality Date  . BACK SURGERY  05/2003  . BACK SURGERY  06/2014  . BACK SURGERY  08/2015  . BLADDER SURGERY  02/2009  . CARDIAC CATHETERIZATION    . CARDIOVASCULAR STRESS TEST    . COLONOSCOPY N/A 2005 [place]   Dr. Nathaneil Canary Shiflett: 5 polyps removed from cecal area all were benign  . COLONOSCOPY  05/2011   Dr. Nathaneil Canary Shiflett:no polyps, diverticulosis  . ESOPHAGOGASTRODUODENOSCOPY  2005 [place]   Dr. Nathaneil Canary Shiflett: normal except for antral gastritis  . EUS  09/2011   [facility]: chronic pancreatitis vs chronic smodering pancreatitis  . FLEXIBLE SIGMOIDOSCOPY  02/2012   Dr. Nathaneil Canary Shiflett: advance up to 60cm, entire left colon with intense UC flare, no bx as patient was on coumdin  . FLEXIBLE SIGMOIDOSCOPY  11/2011   Dr. Nathaneil Canary Shiflett: diffuse colitis universal, cecal polyp removed (adenoma)  . KNEE SURGERY Left 05/04/2017   partial  . PARTIAL KNEE ARTHROPLASTY Left  05/04/2017   Procedure: UNICOMPARTMENTAL KNEE;  Surgeon: Vickey Huger, MD;  Location: [facility];  Service: Orthopedics;  Laterality: Left;  . REPLACEMENT TOTAL KNEE Right   . US ECHOCARDIOGRAPHY     Allergies  Allergen Reactions  . No Known Allergies    Current Outpatient Medications  Medication Sig Dispense Refill  . acetaminophen (TYLENOL) 500 MG tablet Take 1,000 mg by mouth every 8 (eight) hours as needed for mild pain. Reported on 04/23/2016    . Ascorbic Acid (VITAMIN C PLUS W[place] HIPS PO) Take 1,200 mg by mouth daily.    . Calcium Carbonate-Vit D-Min (CALCIUM 1200 PO) Take 1 capsule by mouth daily. One daily    . cholecalciferol (VITAMIN D) 400 units TABS tablet Take 800 Units by mouth daily. TAKE 2 TABLETS    . CREON 36000 units CPEP capsule TAKE TWO CAPSULES BY MOUTH WITH BREAKFAST, LUNCH AND SUPPER    . diphenhydrAMINE (BENADRYL) 25 mg capsule Take 25 mg by mouth at bedtime as needed for sleep.     Marland Kitchen ELIQUIS 5 MG TABS tablet Take 5 mg by mouth 2 (two) times daily.     . febuxostat (ULORIC) 40 MG tablet Take 40 mg by mouth daily.    . finasteride (PROSCAR) 5 MG tablet Take 5 mg by mouth daily.     . Glucosamine Sulfate 500 MG CAPS Take 500 mg by mouth 2 (two) times daily.     Marland Kitchen levothyroxine (SYNTHROID,  LEVOTHROID) 50 MCG tablet Take 50 mcg by mouth daily before breakfast.     . PACERONE 100 MG tablet Take 100 mg by mouth daily.     . pantoprazole 40 MG tablet Take 40 mg by mouth daily.     . predniSONE 1 MG tablet Take 1 mg by mouth daily.    . tamsulosin (FLOMAX) 0.4 MG CAPS capsule Take 0.4 mg by mouth daily after supper.     .      . hydrochlorothiazide  25 MG tablet Take 25 mg by mouth daily.     . metoprolol (LOPRESSOR) 50 MG tablet Take 50 mg by mouth every 8 (eight) hours.     Marland Kitchen HUMIRA 40 MG SQ Q2 WEEKS      Review of Systems PER HPI OTHERWISE ALL SYSTEMS ARE NEGATIVE.    Objective:   Physical Exam  Constitutional: He is oriented to person, place, and time. He  appears well-developed and well-nourished. No distress.  HENT:  Head: Normocephalic and atraumatic.  Mouth/Throat: Oropharynx is clear and moist. No oropharyngeal exudate.  Eyes: Pupils are equal, round, and reactive to light. No scleral icterus.  Neck: Normal range of motion. Neck supple.  Cardiovascular: Normal rate, regular rhythm and normal heart sounds.  Pulmonary/Chest: Effort normal and breath sounds normal. No respiratory distress.  Abdominal: Soft. Bowel sounds are normal. He exhibits no distension. There is no tenderness.  Musculoskeletal: He exhibits no edema.  Lymphadenopathy:    He has no cervical adenopathy.  Neurological: He is alert and oriented to person, place, and time.  NO NEW FOCAL DEFICITS, HARD OF HEARING  Psychiatric: He has a normal mood and affect.  Vitals reviewed.     Assessment & Plan:

## 2017-11-26 NOTE — Assessment & Plan Note (Signed)
CHECK VIT D LEVEL.

## 2017-11-26 NOTE — Progress Notes (Signed)
Pt is aware.  

## 2017-11-26 NOTE — Progress Notes (Signed)
CC'D TO PCP °

## 2017-11-26 NOTE — Assessment & Plan Note (Addendum)
CHECK CBC.

## 2017-11-26 NOTE — Progress Notes (Signed)
ON RECALL  °

## 2017-11-26 NOTE — Patient Instructions (Addendum)
CONTINUE HUMIRA.   COMPLETE LABS: LIVER PANEL, BLOOD COUNT, IMMUNITY TO MEASLES, MUMPS, RUBELLA, VARICELLA, AND VITAMIN D LEVEL.  PLEASE CALL WITH QUESTIONS OR CONCERNS.  TAPER PREDNISONE. START WITH 1 MG EVERY OTHER DAY FOR 30 DAYS THE 1 MG EVERY 3 DAYS FOR ONE MONTH THEN ONCE A WEEK FOR ONE MO AND THEN STOP.  FOLLOW UP IN 4 MOS.

## 2017-11-26 NOTE — Assessment & Plan Note (Signed)
SYMPTOMS CONTROLLED/RESOLVED ON HUMIRA. TOLERATING HUMIRA W/O SIDE EFFECTS.  CONTINUE HUMIRA.  COMPLETE LABS. PLEASE CALL WITH QUESTIONS OR CONCERNS. TAPER PREDNISONE. START WITH 1 MG EVERY OTHER DAY FOR 30 DAYS THE 1 MG EVERY 3 DAYS FOR ONE MONTH THEN ONCE A WEEK FOR ONE MO AND THEN STOP.  FOLLOW UP IN 4 MOS.

## 2017-11-26 NOTE — Assessment & Plan Note (Addendum)
CHECK VIT D.

## 2017-11-27 LAB — COMPLETE METABOLIC PANEL WITH GFR
AG RATIO: 1.5 (calc) (ref 1.0–2.5)
ALT: 13 U/L (ref 9–46)
AST: 18 U/L (ref 10–35)
Albumin: 4 g/dL (ref 3.6–5.1)
Alkaline phosphatase (APISO): 64 U/L (ref 40–115)
BILIRUBIN TOTAL: 0.5 mg/dL (ref 0.2–1.2)
BUN / CREAT RATIO: 20 (calc) (ref 6–22)
BUN: 27 mg/dL — ABNORMAL HIGH (ref 7–25)
CHLORIDE: 104 mmol/L (ref 98–110)
CO2: 28 mmol/L (ref 20–32)
Calcium: 9 mg/dL (ref 8.6–10.3)
Creat: 1.33 mg/dL — ABNORMAL HIGH (ref 0.70–1.11)
GFR, EST AFRICAN AMERICAN: 58 mL/min/{1.73_m2} — AB (ref >=60)
GFR, Est Non African American: 50 mL/min/{1.73_m2} — ABNORMAL LOW (ref >=60)
GLOBULIN: 2.6 g/dL (ref 1.9–3.7)
Glucose, Bld: 105 mg/dL (ref 65–139)
POTASSIUM: 4.1 mmol/L (ref 3.5–5.3)
SODIUM: 139 mmol/L (ref 135–146)
Total Protein: 6.6 g/dL (ref 6.1–8.1)

## 2017-11-27 LAB — CBC WITH DIFFERENTIAL/PLATELET
BASOS ABS: 20 {cells}/uL (ref 0–200)
Basophils Relative: 0.4 %
EOS PCT: 2.9 %
Eosinophils Absolute: 142 cells/uL (ref 15–500)
HEMATOCRIT: 33.3 % — AB (ref 38.5–50.0)
Hemoglobin: 11.2 g/dL — ABNORMAL LOW (ref 13.2–17.1)
LYMPHS ABS: 1019 {cells}/uL (ref 850–3900)
MCH: 29.6 pg (ref 27.0–33.0)
MCHC: 33.6 g/dL (ref 32.0–36.0)
MCV: 87.9 fL (ref 80.0–100.0)
MPV: 10.1 fL (ref 7.5–12.5)
Monocytes Relative: 10.5 %
NEUTROS PCT: 65.4 %
Neutro Abs: 3205 cells/uL (ref 1500–7800)
Platelets: 164 10*3/uL (ref 140–400)
RBC: 3.79 10*6/uL — ABNORMAL LOW (ref 4.20–5.80)
RDW: 16.3 % — AB (ref 11.0–15.0)
Total Lymphocyte: 20.8 %
WBC: 4.9 10*3/uL (ref 3.8–10.8)
WBCMIX: 515 {cells}/uL (ref 200–950)

## 2017-11-27 LAB — MEASLES/MUMPS/RUBELLA IMMUNITY
Mumps IgG: <9 AU/mL — ABNORMAL LOW
Rubella: 32.5 index
Rubeola IgG: >300 AU/mL

## 2017-11-27 LAB — VARICELLA ZOSTER ANTIBODY, IGG: Varicella IgG: 3163 index

## 2017-11-27 LAB — VITAMIN D 25 HYDROXY (VIT D DEFICIENCY, FRACTURES): Vit D, 25-Hydroxy: 53 ng/mL (ref 30–100)

## 2017-11-30 ENCOUNTER — Telehealth: Payer: Self-pay | Admitting: Gastroenterology

## 2017-11-30 DIAGNOSIS — K5100 Ulcerative (chronic) pancolitis without complications: Secondary | ICD-10-CM

## 2017-11-30 NOTE — Telephone Encounter (Signed)
PLEASE CALL PT. HIS LIVER PANEL & VIT D LEVELS ARE NORMAL. HIS IMMUNE TO MEASLES, & RUBELLA BUT NOT MUMPS. HE HAS HAD CHICKEN POX.

## 2017-12-01 NOTE — Telephone Encounter (Signed)
Pt is aware and said that he has had the mumps.

## 2017-12-09 ENCOUNTER — Telehealth: Payer: Self-pay

## 2017-12-09 NOTE — Telephone Encounter (Signed)
Pt was approved through [place] for patient assistance with Humira through November 09, 2018.  Paperwork scanned in.

## 2017-12-31 ENCOUNTER — Telehealth: Payer: Self-pay

## 2017-12-31 NOTE — Telephone Encounter (Signed)
Pt is aware and said that Dr. Scarlette Shorts will be doing that.

## 2017-12-31 NOTE — Telephone Encounter (Signed)
PLEASE CALL PT. It is ok to [place].

## 2017-12-31 NOTE — Telephone Encounter (Signed)
Pt called and said he is having so much trouble with arthritis, he can hardly get around. His rheumatologist, Dr. Scarlette Shorts, in [place], suggested he change from Humira to Cimzia. He said the Humira is really helping his Crohn's but is doing nothing for his arthritis. Dr. Scarlette Shorts can be reached at [phone] and his fax number is [phone].  Dr. Oneida Alar, please advise!

## 2018-02-08 ENCOUNTER — Encounter: Payer: Self-pay | Admitting: Gastroenterology

## 2018-05-27 ENCOUNTER — Ambulatory Visit (INDEPENDENT_AMBULATORY_CARE_PROVIDER_SITE_OTHER): Payer: Medicare Other | Admitting: Gastroenterology

## 2018-05-27 ENCOUNTER — Encounter: Payer: Self-pay | Admitting: Gastroenterology

## 2018-05-27 ENCOUNTER — Other Ambulatory Visit: Payer: Self-pay | Admitting: *Deleted

## 2018-05-27 DIAGNOSIS — K5100 Ulcerative (chronic) pancolitis without complications: Secondary | ICD-10-CM | POA: Diagnosis not present

## 2018-05-27 DIAGNOSIS — M1990 Unspecified osteoarthritis, unspecified site: Secondary | ICD-10-CM

## 2018-05-27 DIAGNOSIS — K861 Other chronic pancreatitis: Secondary | ICD-10-CM

## 2018-05-27 NOTE — Patient Instructions (Addendum)
See DR. Leigh Aurora, RHEUMATOLOGY, TO DISCUSS BETTER CONTROL OF YOUR ARTHRITIS PAIN.   I WILL GET LABS FROM DR. [place] NEED A BLOOD COUNT AND LIVER PANEL TWICE A YEAR. FAX RESULTS TO [phone].   CONTINUE HUMIRA.  GET YOUR FLU SHOT IN SEP OR OCT 2019.   PLEASE CALL WITH QUESTIONS OR CONCERNS.  FOLLOW UP IN 6 MOS.

## 2018-05-27 NOTE — Assessment & Plan Note (Addendum)
SYMPTOMS CONTROLLED/RESOLVED. PREDNISONE IS OFF.  CONTINUE TO MONITOR SYMPTOMS. LIVER PANEL/CBC Q6 MOS. See DR. Leigh Aurora, RHEUMATOLOGY, TO DISCUSS BETTER CONTROL OF YOUR ARTHRITIS PAIN. FLU  SHOT EVERY YEAR. I WILL GET LABS FROM DR. [place]. PLEASE CALL WITH QUESTIONS OR CONCERNS.  FOLLOW UP IN 6 MOS.  CONTINUE HUMIRA. FOLLOW UP IN 6 MOS.

## 2018-05-27 NOTE — Progress Notes (Signed)
Subjective:    Patient ID: Louis Robert., male    DOB: Feb 01, 1935, 83 y.o.   MRN: [id number]  Andres Shad, MD   HPI COLON AND PANCREAS ARE GOOD. ONLY TROUBLE WITH ARTHRITIS. LAST KNEE REPLACEMENT SPRING 2018. HAVE LEFT HIP PAIN. FEELS BETTER IN [place] AND FALL.WORSE IN SUMMER. WAS FINE IN [place]. THINKS KNEES ARE OK. HAVING BACK AND HIP PAIN AFTER FALLING. NO PREDNISONE. CREON WITH ALL THREE MEALS. BMs: GREAT, 1-2 X/DAILY. APPETITE IS TOO GOOD.    PT DENIES FEVER, CHILLS, HEMATOCHEZIA, HEMATEMESIS, nausea, vomiting, melena, diarrhea, CHEST PAIN, SHORTNESS OF BREATH,  CHANGE IN BOWEL IN HABITS, constipation, abdominal pain, problems swallowing, problems with sedation, heartburn or indigestion.  Past Medical History:  Diagnosis Date  . A-fib ([place])    Dr. Rosalita Chessman  . Bladder cancer ([place]) 2004   instilled chemo  . Chronic renal insufficiency, stage III (moderate) ([place])    [facility]  . Hypertension   . Hypothyroidism   . Osteoarthritis   . Pancreas divisum, native   . Pancreatitis    Dr.  Oliva Bustard. Shifflett  . UC (ulcerative colitis) ([place])    Dr. Spainhour/Dr. Shifflett: diagnosed 11/2011 involved entire colon   Past Surgical History:  Procedure Laterality Date  . BACK SURGERY  05/2003  . BACK SURGERY  06/2014  . BACK SURGERY  08/2015  . BLADDER SURGERY  02/2009  . CARDIAC CATHETERIZATION    . CARDIOVASCULAR STRESS TEST    . COLONOSCOPY N/A 2005 [place]   Dr. Nathaneil Canary Shiflett: 5 polyps removed from cecal area all were benign  . COLONOSCOPY  05/2011   Dr. Nathaneil Canary Shiflett:no polyps, diverticulosis  . ESOPHAGOGASTRODUODENOSCOPY  2005 [place]   Dr. Nathaneil Canary Shiflett: normal except for antral gastritis  . EUS  09/2011   [facility]: chronic pancreatitis vs chronic smodering pancreatitis  . FLEXIBLE SIGMOIDOSCOPY  02/2012   Dr. Nathaneil Canary Shiflett: advance up to 60cm, entire left colon with intense UC flare, no bx as patient was on coumdin  .  FLEXIBLE SIGMOIDOSCOPY  11/2011   Dr. Nathaneil Canary Shiflett: diffuse colitis universal, cecal polyp removed (adenoma)  . KNEE SURGERY Left 05/04/2017   partial  . PARTIAL KNEE ARTHROPLASTY Left 05/04/2017   Procedure: UNICOMPARTMENTAL KNEE;  Surgeon: Vickey Huger, MD;  Location: [facility];  Service: Orthopedics;  Laterality: Left;  . REPLACEMENT TOTAL KNEE Right   . US ECHOCARDIOGRAPHY     Allergies  Allergen Reactions  . No Known Allergies    Current Outpatient Medications  Medication Sig    . acetaminophen (TYLENOL) 500 MG tablet Take 1,000 mg by mouth every 8 (eight) hours as needed for mild pain. Reported on 04/23/2016    . Adalimumab (HUMIRA [place]) Inject into the skin every 14 (fourteen) days. 40 mg    . alendronate (FOSAMAX) 70 MG tablet Take 70 mg by mouth once a week.    . carvedilol (COREG) 25 MG tablet Take 25 mg by mouth 2 (two) times daily.    . Cholecalciferol (VITAMIN D3) 50000 units CAPS Take 1 capsule by mouth once a week.    Marland Kitchen CREON 36000 units CPEP capsule TAKE TWO CAPSULES BY MOUTH WITH BREAKFAST, LUNCH AND SUPPER    . diphenhydrAMINE (BENADRYL) 25 mg capsule Take 25 mg by mouth at bedtime as needed for sleep.     Marland Kitchen ELIQUIS 5 MG TABS tablet Take 5 mg by mouth 2 (two) times daily.     . febuxostat (ULORIC) 40 MG tablet Take 40  mg by mouth daily.    . finasteride (PROSCAR) 5 MG tablet Take 5 mg by mouth daily.     . folic acid (FOLVITE) 1 MG tablet Take 1 mg by mouth daily.    Marland Kitchen levothyroxine (SYNTHROID, LEVOTHROID) 75 MCG tablet Take 1 tablet by mouth daily.    Marland Kitchen OVER THE COUNTER MEDICATION Osteo Bi Flex + Vitamin D twice a day    . PACERONE 100 MG tablet Take 100 mg by mouth daily.     . pantoprazole (PROTONIX) 40 MG tablet Take 40 mg by mouth daily.     . tamsulosin (FLOMAX) 0.4 MG CAPS capsule Take 0.4 mg by mouth daily after supper.     . vitamin C (ASCORBIC ACID) 500 MG tablet Take 500 mg by mouth daily.    . Ascorbic Acid (VITAMIN C PLUS WILD [place] HIPS PO) Take 1,200 mg by  mouth daily.    . Calcium Carbonate-Vit D-Min (CALCIUM 1200 PO) Take 1 capsule by mouth 2 (two) times daily. Calcium 1240m + Vitamin D3 1600 IU    . cholecalciferol (VITAMIN D) 400 units TABS tablet Take 800 Units by mouth daily. TAKE 2 TABLETS    . Glucosamine Sulfate 500 MG CAPS Take 500 mg by mouth 2 (two) times daily.     . hydrochlorothiazide (HYDRODIURIL) 25 MG tablet Take 25 mg by mouth daily.       Take 50 mcg by mouth daily before breakfast.     . metoprolol (LOPRESSOR) 50 MG tablet Take 50 mg by mouth every 8 (eight) hours.     .       Review of Systems PER HPI OTHERWISE ALL SYSTEMS ARE NEGATIVE.    Objective:   Physical Exam        Assessment & Plan:

## 2018-05-27 NOTE — Assessment & Plan Note (Signed)
SYMPTOMS CONTROLLED/RESOLVED.  CONTINUE CREON. LOW FAT DIET FOLLOW UP IN 6 MOS.

## 2018-05-28 LAB — SPECIMEN COMPROMISED

## 2018-05-28 LAB — HEPATIC FUNCTION PANEL
AG Ratio: 1.5 (calc) (ref 1.0–2.5)
ALT: 15 U/L (ref 9–46)
AST: 21 U/L (ref 10–35)
Albumin: 4.1 g/dL (ref 3.6–5.1)
Alkaline phosphatase (APISO): 72 U/L (ref 40–115)
Bilirubin, Direct: 0.1 mg/dL (ref 0.0–0.2)
Globulin: 2.7 g/dL (calc) (ref 1.9–3.7)
Indirect Bilirubin: 0.2 mg/dL (calc) (ref 0.2–1.2)
Total Bilirubin: 0.3 mg/dL (ref 0.2–1.2)
Total Protein: 6.8 g/dL (ref 6.1–8.1)

## 2018-05-28 NOTE — Progress Notes (Signed)
CC'ED TO PCP 

## 2018-06-01 NOTE — Progress Notes (Signed)
Pt is aware.  

## 2018-06-01 NOTE — Progress Notes (Signed)
CC'D TO PCP °

## 2018-07-13 ENCOUNTER — Telehealth: Payer: Self-pay | Admitting: Gastroenterology

## 2018-07-13 NOTE — Telephone Encounter (Signed)
Pt called to let nurse know that he does not qualify for [facility]  [phone]

## 2018-07-13 NOTE — Telephone Encounter (Addendum)
PT called to say that he will not qualify for the Humira this time, that he makes too much money. He is going to talk to his Ambassador person from Patient Assistance. He would like for [place] to send in the refill that we have for the Humira. He said he assistance is good til December. He asked if Dr. Oneida Alar would consider putting him back on Delzicol if he does not qualify for patient assistance. He said it would costs him $700.00 a month or $8400.00 yearly. He is just looking ahead before the end of the year. He is aware that Dr. Oneida Alar is not in today.  Paper work on Dr. Oneida Alar chair to complete for the Humira and I will fax it.

## 2018-07-14 NOTE — Telephone Encounter (Signed)
Paperwork faxed to [employer].

## 2018-07-14 NOTE — Telephone Encounter (Signed)
Paperwork completed sep 3.

## 2018-09-07 ENCOUNTER — Telehealth: Payer: Self-pay | Admitting: Gastroenterology

## 2018-09-07 NOTE — Telephone Encounter (Signed)
Patient had MRI 11/2016. Please advise when next MRI is due Dr. Oneida Alar. Thanks

## 2018-09-07 NOTE — Telephone Encounter (Signed)
RECALL FOR 2 YR MRI

## 2018-09-08 NOTE — Telephone Encounter (Signed)
ON RECALL  °

## 2018-09-08 NOTE — Telephone Encounter (Signed)
Stacey please [place] for Jan 2020. Thanks

## 2018-09-08 NOTE — Telephone Encounter (Signed)
PT NEEDS pre and post contrast MRI/MRCP or pancreatic protocol CT in JAN 2020. This recommendation follows ACR consensus guidelines.

## 2018-09-15 ENCOUNTER — Other Ambulatory Visit: Payer: Self-pay | Admitting: Gastroenterology

## 2018-10-01 ENCOUNTER — Telehealth: Payer: Self-pay | Admitting: Gastroenterology

## 2018-10-01 NOTE — Telephone Encounter (Signed)
[phone]  PLEASE CALL PATIENT   HIS INSURANCE WILL NOT COVER HUMIRA AGAIN AND HE WANTS TO DISCUSS GOING BACK ON SOMETHING ELSE.

## 2018-10-04 NOTE — Telephone Encounter (Addendum)
Pt said he will no longer be eligible for the Humira because of his income. He wants to know does he go back on Delzicol?  He has enough Humira til Nov 10 2018. He is aware Dr. Oneida Alar is off this week and I will send her the message to advise when she returns.

## 2018-10-22 ENCOUNTER — Encounter: Payer: Self-pay | Admitting: Gastroenterology

## 2018-10-22 ENCOUNTER — Telehealth: Payer: Self-pay | Admitting: Gastroenterology

## 2018-10-22 NOTE — Telephone Encounter (Signed)
RECALL FOR CT PANCREATIC PROTOCOL

## 2018-10-22 NOTE — Telephone Encounter (Signed)
Letter mailed

## 2018-10-25 ENCOUNTER — Encounter: Payer: Self-pay | Admitting: Gastroenterology

## 2018-10-25 NOTE — Telephone Encounter (Signed)
Dr. Oneida Alar, please advise!

## 2018-10-25 NOTE — Telephone Encounter (Addendum)
PLEASE CALL PT. HE HAS TWO ALTERNATIVES FOR TREATMENT: 1. IMURAN 100 MG DAILY. IMURAN CAN CAUSE NAUSEA/VOMITING, A LOW WBC COUNT, PANCREATITIS OR HEPATITIS BUT MOST OF THE TIME IT IS WELL TOLERATED. AFTER STARTING IMURAN HE WILL GET LABS DRAWN IN 2 WEEKS, 4 WEEKS AND THEN EVERY 3 MOS, OR 2. RE-START DELZICOL WHICH HAS BEEN ASSOCIATED WITH WORSENING KIDNEY FUNCTION.  HE SHOULD LET ME KNOW WHAT HE DECIDES AND I WILL SEND THE RX FOR WHICHEVER MED HE CHOOSES. I PREFER OPTION 1.  HE SHOULD ALSO COMPLETE A DEXA SCAN WITHIN THE NEXT 1-2 MOS. Dx: OSTEOPENIA/CHRONIC STEROID USE.  OPV IN 1-2 MOS W/ SLF, DX: UC.

## 2018-10-25 NOTE — Telephone Encounter (Signed)
PATIENT SCHEDULED AND LETTER SENT  °

## 2018-10-25 NOTE — Telephone Encounter (Signed)
LM with pt's wife for a return call to discuss.

## 2018-10-26 NOTE — Telephone Encounter (Signed)
Pt is aware. He can still get the Humira for another 8 weeks. He will do that for now. He will think about the options and let us know in plenty of time. He thinks he has had Dexa and will find out and get [place] a copy.

## 2018-10-26 NOTE — Telephone Encounter (Signed)
REVIEWED-NO ADDITIONAL RECOMMENDATIONS. 

## 2018-12-02 ENCOUNTER — Telehealth: Payer: Self-pay

## 2018-12-02 NOTE — Telephone Encounter (Signed)
Pt called and has another couple of weeks on his Humira. He is ready for Dr. Oneida Alar to send in an Rx for the Imuran. He doesn't think his insurance will cover it, but wants to get started on it before he runs out of the Humira. He is checking on the Bone Density Study and if he hasn't had one, he might want Dr. Oneida Alar to order one.  He would like Rx for Imuran sent to [facility] in [place] at [address].

## 2018-12-09 MED ORDER — AZATHIOPRINE 50 MG PO TABS: ORAL_TABLET | ORAL | 11 refills | Status: DC

## 2018-12-09 NOTE — Telephone Encounter (Signed)
PLEASE CALL PT. RX SENT FOR IMURAN 100 MG DAILY. THE DOSE IS SLIGHTLY REDUCED BECAUSE YOU TAKE ALLOPURINOL. GET LABS(CBC/HFP) DRAWN ON FEB 7, FEB 21 & MAR 6 & THEN EVERY 3 MOS FOR ONE YEAR.  FOLLOW UP IN MAR 19.

## 2018-12-09 NOTE — Telephone Encounter (Signed)
Dr. Oneida Alar, per Judeen Hammans at the pharmacy [phone])  They sent you a note via your prescription that you need to override the med due to the interaction with Uloric . Insurance will not pay until this is taken care of.

## 2018-12-09 NOTE — Telephone Encounter (Signed)
appt slot has been opened

## 2018-12-09 NOTE — Telephone Encounter (Signed)
CM, will you open up March 19 at 1145 (E15) so I can add patient on the schedule.

## 2018-12-10 NOTE — Telephone Encounter (Signed)
Called pt to find out how much Humira he has left. He had previously said he had enough til about February.  LMOM for a return call.

## 2018-12-10 NOTE — Telephone Encounter (Signed)
PT called and said he would need Humira on Feb 12th and will not get one then. He said if Dr. Oneida Alar needs him to come in, he can come most anytime she requests. He is aware we are working on the Imuran.

## 2018-12-10 NOTE — Telephone Encounter (Signed)
Pt is aware.  

## 2018-12-10 NOTE — Telephone Encounter (Signed)
PT ALREADY HAS AN APPT MAR 19 @230P . CALLED PHARMACY BUT IT DOES NOT OPEN UNTIL 9 AM.

## 2018-12-10 NOTE — Telephone Encounter (Signed)
Spoke with PHARMACY(John Booker). Pt will placed on Imuran 75 mg daily. PLEASE CALL PT & let him know.

## 2018-12-13 NOTE — Telephone Encounter (Signed)
Reminder in epic to have labs done every 3 months for one year

## 2018-12-14 ENCOUNTER — Telehealth: Payer: Self-pay | Admitting: Gastroenterology

## 2018-12-14 ENCOUNTER — Other Ambulatory Visit: Payer: Self-pay

## 2018-12-14 DIAGNOSIS — K51919 Ulcerative colitis, unspecified with unspecified complications: Secondary | ICD-10-CM

## 2018-12-14 MED ORDER — AZATHIOPRINE 50 MG PO TABS: ORAL_TABLET | ORAL | 11 refills | Status: DC

## 2018-12-14 NOTE — Telephone Encounter (Signed)
Called and left Vm that Dr. Oneida Alar would like for him to start the Imuran 75 mg daily today. I will call him back and discuss the labs.

## 2018-12-14 NOTE — Telephone Encounter (Signed)
Pt called asking to speak with DS. He has a medication he needed to go over with her. Please call [phone]

## 2018-12-14 NOTE — Telephone Encounter (Signed)
See previous note about his Imuran.

## 2018-12-14 NOTE — Progress Notes (Signed)
cbc

## 2018-12-14 NOTE — Telephone Encounter (Signed)
SEE TC.

## 2018-12-14 NOTE — Telephone Encounter (Signed)
Pt is aware and I have mailed the three lab orders to him.  He said he will do them in [place].

## 2018-12-14 NOTE — Telephone Encounter (Signed)
Pt took last Humira last Wed, Jan 29th  And due next Humira on 12/22/2018. He has picked up the Imuran and wants to know when he should start it. Also, when should he start labs?

## 2018-12-14 NOTE — Telephone Encounter (Addendum)
PLEASE CALL PT. HE SHOULD START IMURAN 75 MG DAILY TODAY. CBC/HFP FEB 11, FEB 25, & MAR 24.

## 2018-12-15 ENCOUNTER — Other Ambulatory Visit: Payer: Self-pay | Admitting: Physical Medicine and Rehabilitation

## 2018-12-15 ENCOUNTER — Telehealth: Payer: Self-pay

## 2018-12-15 DIAGNOSIS — M961 Postlaminectomy syndrome, not elsewhere classified: Secondary | ICD-10-CM

## 2018-12-15 NOTE — Telephone Encounter (Signed)
Phone call to patient to verify medication list and allergies for myelogram procedure. Pt aware that he will need to hold Eliquis for 2 days prior to myelogram, pending approval and further recommendations from Dr. Rosalita Chessman. Faxed thinner hold request, awaiting reply.

## 2018-12-20 ENCOUNTER — Other Ambulatory Visit: Payer: Self-pay | Admitting: Gastroenterology

## 2018-12-21 LAB — CBC WITH DIFFERENTIAL/PLATELET
Basophils Absolute: 0 10*3/uL (ref 0.0–0.2)
Basos: 1 %
EOS (ABSOLUTE): 0.1 10*3/uL (ref 0.0–0.4)
Eos: 2 %
Hematocrit: 36.7 % — ABNORMAL LOW (ref 37.5–51.0)
Hemoglobin: 12.3 g/dL — ABNORMAL LOW (ref 13.0–17.7)
Immature Grans (Abs): 0 10*3/uL (ref 0.0–0.1)
Immature Granulocytes: 0 %
Lymphocytes Absolute: 1.9 10*3/uL (ref 0.7–3.1)
Lymphs: 32 %
MCH: 30.9 pg (ref 26.6–33.0)
MCHC: 33.5 g/dL (ref 31.5–35.7)
MCV: 92 fL (ref 79–97)
MONOS ABS: 0.4 10*3/uL (ref 0.1–0.9)
Monocytes: 7 %
Neutrophils Absolute: 3.3 10*3/uL (ref 1.4–7.0)
Neutrophils: 58 %
Platelets: 211 10*3/uL (ref 150–450)
RBC: 3.98 x10E6/uL — ABNORMAL LOW (ref 4.14–5.80)
RDW: 12.9 % (ref 11.6–15.4)
WBC: 5.8 10*3/uL (ref 3.4–10.8)

## 2018-12-21 LAB — HEPATIC FUNCTION PANEL
ALT: 16 IU/L (ref 0–44)
AST: 23 IU/L (ref 0–40)
Albumin: 4.1 g/dL (ref 3.6–4.6)
Alkaline Phosphatase: 71 IU/L (ref 39–117)
Bilirubin Total: 0.3 mg/dL (ref 0.0–1.2)
Bilirubin, Direct: 0.1 mg/dL (ref 0.00–0.40)
Total Protein: 6.9 g/dL (ref 6.0–8.5)

## 2018-12-28 ENCOUNTER — Telehealth: Payer: Self-pay | Admitting: Gastroenterology

## 2018-12-28 NOTE — Telephone Encounter (Signed)
PLEASE CALL PT. HIS BLOOD COUNT IS IMPROVED TO 12.3 AND HIS LIVER PANEL IS NORMAL.

## 2018-12-29 NOTE — Telephone Encounter (Signed)
PT is aware.

## 2018-12-29 NOTE — Telephone Encounter (Signed)
He is also his next labs are due 01/04/2019 and 02/01/2019. He has those orders.

## 2019-01-04 ENCOUNTER — Other Ambulatory Visit: Payer: Self-pay | Admitting: Gastroenterology

## 2019-01-05 ENCOUNTER — Telehealth: Payer: Self-pay | Admitting: Gastroenterology

## 2019-01-05 LAB — HEPATIC FUNCTION PANEL
ALBUMIN: 4.1 g/dL (ref 3.6–4.6)
ALT: 13 IU/L (ref 0–44)
AST: 14 IU/L (ref 0–40)
Alkaline Phosphatase: 73 IU/L (ref 39–117)
BILIRUBIN TOTAL: 0.4 mg/dL (ref 0.0–1.2)
Bilirubin, Direct: 0.11 mg/dL (ref 0.00–0.40)
Total Protein: 6.8 g/dL (ref 6.0–8.5)

## 2019-01-05 LAB — CBC WITH DIFFERENTIAL/PLATELET
Basophils Absolute: 0 10*3/uL (ref 0.0–0.2)
Basos: 1 %
EOS (ABSOLUTE): 0.2 10*3/uL (ref 0.0–0.4)
EOS: 4 %
Hematocrit: 34.6 % — ABNORMAL LOW (ref 37.5–51.0)
Hemoglobin: 11.9 g/dL — ABNORMAL LOW (ref 13.0–17.7)
IMMATURE GRANULOCYTES: 0 %
Immature Grans (Abs): 0 10*3/uL (ref 0.0–0.1)
Lymphocytes Absolute: 1.5 10*3/uL (ref 0.7–3.1)
Lymphs: 34 %
MCH: 32.2 pg (ref 26.6–33.0)
MCHC: 34.4 g/dL (ref 31.5–35.7)
MCV: 94 fL (ref 79–97)
Monocytes Absolute: 0.2 10*3/uL (ref 0.1–0.9)
Monocytes: 4 %
Neutrophils Absolute: 2.5 10*3/uL (ref 1.4–7.0)
Neutrophils: 57 %
Platelets: 137 10*3/uL — ABNORMAL LOW (ref 150–450)
RBC: 3.7 x10E6/uL — ABNORMAL LOW (ref 4.14–5.80)
RDW: 13.6 % (ref 11.6–15.4)
WBC: 4.3 10*3/uL (ref 3.4–10.8)

## 2019-01-05 NOTE — Telephone Encounter (Signed)
PLEASE CALL PT. HIS LIVER PANEL IS NORMAL. HIS CBC SHOWS A STABLE Hb AT 11.9 AND LOW PLATELET COUNT. HE SHOULD HAVE ANOTHER CBC IN 2 WEEKS.

## 2019-01-05 NOTE — Telephone Encounter (Signed)
PT is aware.

## 2019-01-27 ENCOUNTER — Ambulatory Visit: Payer: Medicare Other | Admitting: Gastroenterology

## 2019-02-01 ENCOUNTER — Other Ambulatory Visit: Payer: Self-pay

## 2019-02-01 ENCOUNTER — Telehealth: Payer: Self-pay | Admitting: Gastroenterology

## 2019-02-01 DIAGNOSIS — K51919 Ulcerative colitis, unspecified with unspecified complications: Secondary | ICD-10-CM

## 2019-02-01 NOTE — Telephone Encounter (Signed)
Patient called and said that he is supposed to have labs taken today in [place] and he does not have the order, wants to know if it was faxed to the lab   [phone]

## 2019-02-01 NOTE — Telephone Encounter (Signed)
I called pt and he doesn't know what he did with the orders. I am faxing new orders to [facility] @ [phone] [address] Phone number is [phone].

## 2019-02-02 ENCOUNTER — Other Ambulatory Visit: Payer: Self-pay | Admitting: Gastroenterology

## 2019-02-03 ENCOUNTER — Other Ambulatory Visit: Payer: Self-pay

## 2019-02-03 DIAGNOSIS — K51919 Ulcerative colitis, unspecified with unspecified complications: Secondary | ICD-10-CM

## 2019-02-03 LAB — CBC WITH DIFFERENTIAL/PLATELET
Basophils Absolute: 0 10*3/uL (ref 0.0–0.2)
Basos: 1 %
EOS (ABSOLUTE): 0.1 10*3/uL (ref 0.0–0.4)
EOS: 3 %
HEMATOCRIT: 32.8 % — AB (ref 37.5–51.0)
HEMOGLOBIN: 11.5 g/dL — AB (ref 13.0–17.7)
Immature Grans (Abs): 0 10*3/uL (ref 0.0–0.1)
Immature Granulocytes: 0 %
Lymphocytes Absolute: 1.3 10*3/uL (ref 0.7–3.1)
Lymphs: 33 %
MCH: 33.5 pg — ABNORMAL HIGH (ref 26.6–33.0)
MCHC: 35.1 g/dL (ref 31.5–35.7)
MCV: 96 fL (ref 79–97)
Monocytes Absolute: 0.3 10*3/uL (ref 0.1–0.9)
Monocytes: 7 %
NEUTROS PCT: 56 %
Neutrophils Absolute: 2.2 10*3/uL (ref 1.4–7.0)
Platelets: 142 10*3/uL — ABNORMAL LOW (ref 150–450)
RBC: 3.43 x10E6/uL — AB (ref 4.14–5.80)
RDW: 18 % — ABNORMAL HIGH (ref 11.6–15.4)
WBC: 3.8 10*3/uL (ref 3.4–10.8)

## 2019-02-03 LAB — HEPATIC FUNCTION PANEL
ALT: 55 IU/L — ABNORMAL HIGH (ref 0–44)
AST: 53 IU/L — AB (ref 0–40)
Albumin: 4.1 g/dL (ref 3.6–4.6)
Alkaline Phosphatase: 130 IU/L — ABNORMAL HIGH (ref 39–117)
Bilirubin Total: 0.5 mg/dL (ref 0.0–1.2)
Bilirubin, Direct: 0.15 mg/dL (ref 0.00–0.40)
Total Protein: 6.7 g/dL (ref 6.0–8.5)

## 2019-02-03 NOTE — Progress Notes (Signed)
LMOM to call.

## 2019-02-03 NOTE — Progress Notes (Signed)
cc'd to pcp 

## 2019-02-03 NOTE — Progress Notes (Signed)
Pt is aware and will cut back on the Imuran to 50 mg daily. Recheck CBC and LFT's in one month. Lab orders on file.

## 2019-03-02 ENCOUNTER — Telehealth: Payer: Self-pay | Admitting: Gastroenterology

## 2019-03-02 NOTE — Telephone Encounter (Signed)
Lab orders are on file.

## 2019-03-02 NOTE — Telephone Encounter (Signed)
Patient on may recall for labs

## 2019-03-08 NOTE — Progress Notes (Signed)
REVIEWED-NO ADDITIONAL RECOMMENDATIONS. 

## 2019-03-30 ENCOUNTER — Encounter: Payer: Self-pay | Admitting: Gastroenterology

## 2019-03-30 ENCOUNTER — Other Ambulatory Visit: Payer: Self-pay

## 2019-03-30 ENCOUNTER — Ambulatory Visit (INDEPENDENT_AMBULATORY_CARE_PROVIDER_SITE_OTHER): Payer: Medicare Other | Admitting: Gastroenterology

## 2019-03-30 DIAGNOSIS — K861 Other chronic pancreatitis: Secondary | ICD-10-CM

## 2019-03-30 DIAGNOSIS — K5100 Ulcerative (chronic) pancolitis without complications: Secondary | ICD-10-CM | POA: Diagnosis not present

## 2019-03-30 NOTE — Progress Notes (Signed)
Subjective:    Patient ID: Louis Robert., male    DOB: [birth year], 84 y.o.   MRN: [id number]  Primary Care Physician:  Andres Shad, MD  Primary GI:  Barney Drain, MD   Patient Location: home   Provider Location: [facility] office   Reason for Visit: UC-PANCOLITIS, PANCREATITIS   Persons present on the virtual encounter, with roles: patient, myself (provider), MARTINA BOOTH CMA (update meds/allergies)   Total time (minutes) spent on medical discussion:   15 MINUTES   Due to COVID-19, visit was VIA TELEPHONE VISIT DUE TO COVID 19. VISIT IS CONDUCTED VIRTUALLY AND WAS REQUESTED BY PATIENT.   Virtual Visit via TELEPHONE   I connected with Louis Bowers and verified that I am speaking with the correct person using two identifiers.   I discussed the limitations, risks, security and privacy concerns of performing an evaluation and management service by telephone/video and the availability of in person appointments. I also discussed with the patient that there may be a patient responsible charge related to this service. The patient expressed understanding and agreed to proceed.   HPI BOWELS ARE GOOD. BACK PAIN CONSTANTLY AND WANTS TO KNOW ABOUT PAIN MEDS. OFF DAILY PREDNISONE SINCE LAST YEAR. BACK PAIN WORSE OVER PAST 6 MOS. THINKS IT'S NERVES IN HIS BACK. COULDN'T UNDERSTAND DR. SHROFF AND [place] [facility] TOLD HIM HE DOESN'T NEED A RHEUMATOLOGIST. HAD A MYELOGRAM BUT DOESN'T KNOW THE RESULTS.  EATS FRUITS AND FIBER. BMs: 1-2X/DAY. BREATHING A LITTLE SLOWER. LAST LABS MAR 2020.  PT DENIES FEVER, CHILLS, HEMATOCHEZIA, HEMATEMESIS, nausea, vomiting, melena, diarrhea, CHEST PAIN, SHORTNESS OF BREATH, CHANGE IN BOWEL IN HABITS, constipation, abdominal pain, problems swallowing, OR heartburn or indigestion.  Past Medical History:  Diagnosis Date  . A-fib ([place])    Dr. Rosalita Chessman  . Bladder cancer ([place]) 2004   instilled chemo  . Chronic renal insufficiency, stage III (moderate) ([place])    [facility]  . Hypertension   . Hypothyroidism   . Osteoarthritis   . Pancreas divisum, native   . Pancreatitis    Dr.  Oliva Bustard. Shifflett  . UC (ulcerative colitis) ([place])    Dr. Spainhour/Dr. Shifflett: diagnosed 11/2011 involved entire colon   Past Surgical History:  Procedure Laterality Date  . BACK SURGERY  05/2003  . BACK SURGERY  06/2014  . BACK SURGERY  08/2015  . BLADDER SURGERY  02/2009  . CARDIAC CATHETERIZATION    . CARDIOVASCULAR STRESS TEST    . COLONOSCOPY N/A 2005 [place]   Dr. Nathaneil Canary Shiflett: 5 polyps removed from cecal area all were benign  . COLONOSCOPY  05/2011   Dr. Nathaneil Canary Shiflett:no polyps, diverticulosis  . ESOPHAGOGASTRODUODENOSCOPY  2005 [place]   Dr. Nathaneil Canary Shiflett: normal except for antral gastritis  . EUS  09/2011   [facility]: chronic pancreatitis vs chronic smodering pancreatitis  . FLEXIBLE SIGMOIDOSCOPY  02/2012   Dr. Nathaneil Canary Shiflett: advance up to 60cm, entire left colon with intense UC flare, no bx as patient was on coumdin  . FLEXIBLE SIGMOIDOSCOPY  11/2011   Dr. Nathaneil Canary Shiflett: diffuse colitis universal, cecal polyp removed (adenoma)  . KNEE SURGERY Left 05/04/2017   partial  . PARTIAL KNEE ARTHROPLASTY Left 05/04/2017   Procedure: UNICOMPARTMENTAL KNEE;  Surgeon: Vickey Huger, MD;  Location: [facility];  Service: Orthopedics;  Laterality: Left;  . REPLACEMENT TOTAL KNEE Right   . US ECHOCARDIOGRAPHY     Allergies  Allergen Reactions  . No Known Allergies    Current Outpatient Medications  Medication Sig    . TYLENOL 500 MG tablet Take 1,000 mg by mouth every 8 (eight) hours as needed for mild pain. Reported on 04/23/2016    . VITAMIN C  Take 1,200 mg by mouth daily.    Marland Kitchen azaTHIOprine (IMURAN) 50 MG tablet 1 PO DAILY    . Calcium Carbonate-Vit D-Min (CALCIUM 1200 PO) Take 1 capsule by mouth 2 (two) times daily. Calcium 1222m + Vitamin D3 1600 IU    . carvedilol (COREG) 25 MG tablet Take 25 mg by mouth 2 (two) times  daily.    . cholecalciferol (VITAMIN D) 400 units TABS tablet Take 800 Units by mouth daily. TAKE 2 TABLETS    .      .Marland KitchenCREON 36000 units CPEP capsule TAKE 2 CAPSULES BY MOUTH WITH BREAKFAST, LUNCH AND SUPPER    . diphenhydrAMINE (BENADRYL) 25 mg capsule Take 25 mg by mouth at bedtime as needed for sleep.     .Marland KitchenELIQUIS 5 MG TABS tablet Take 5 mg by mouth 2 (two) times daily.     . febuxostat (ULORIC) 40 MG tablet Take 40 mg by mouth daily.    . finasteride (PROSCAR) 5 MG tablet Take 5 mg by mouth daily.     . Glucosamine Sulfate 500 MG CAPS Take 500 mg by mouth 2 (two) times daily.     .Marland Kitchenlevothyroxine (SYNTHROID, LEVOTHROID) 75 MCG tablet Take 1 tablet by mouth daily.    .Marland KitchenPACERONE 100 MG tablet Take 100 mg by mouth daily.     . pantoprazole (PROTONIX) 40 MG tablet Take 40 mg by mouth daily.     . tamsulosin (FLOMAX) 0.4 MG CAPS capsule Take 0.4 mg by mouth daily after supper.     . vitamin C (ASCORBIC ACID) 500 MG tablet Take 500 mg by mouth daily.    .      . alendronate (FOSAMAX) 70 MG tablet Take 70 mg by mouth once a week.    . folic acid (FOLVITE) 1 MG tablet Take 1 mg by mouth daily.    . hydrochlorothiazide (HYDRODIURIL) 25 MG tablet Take 25 mg by mouth daily.     .      .Tonia G[place]50 MG tablet Take 50 mg by mouth every 8 (eight) hours.     .Marland KitchenOVER THE COUNTER MEDICATION Osteo Bi Flex + Vitamin D twice a day    .       Review of Systems PER HPI OTHERWISE ALL SYSTEMS ARE NEGATIVE.    Objective:   Physical Exam  TELEPHONE VISIT DUE TO COVID 19, VISIT IS CONDUCTED VIRTUALLY AND WAS REQUESTED BY PATIENT.    Assessment & Plan:

## 2019-03-30 NOTE — Patient Instructions (Addendum)
COMPLETE LABS IN JUN 2020.  CONTINUE CREON AND AZATHIOPRINE.  FOLLOW UP IN 4 MOS.

## 2019-03-30 NOTE — Assessment & Plan Note (Signed)
SYMPTOMS CONTROLLED/RESOLVED.  COMPLETE LABS IN JUN 2020. CONTINUE CREON. FOLLOW UP IN 4 MOS.

## 2019-03-30 NOTE — Assessment & Plan Note (Signed)
SYMPTOMS CONTROLLED/RESOLVED.  COMPLETE LABS IN JUN 2020: HFP/CBC. CONTINUE AZATHIOPRINE. FOLLOW UP IN 4 MOS.

## 2019-04-18 ENCOUNTER — Encounter: Payer: Self-pay | Admitting: Gastroenterology

## 2019-04-26 ENCOUNTER — Telehealth: Payer: Self-pay | Admitting: Gastroenterology

## 2019-04-26 NOTE — Telephone Encounter (Signed)
Pt called to make OV and said that he needed his lab orders faxed to [facility] Fax [phone]

## 2019-04-26 NOTE — Telephone Encounter (Signed)
Pt called back, he found his lab orders.

## 2019-04-26 NOTE — Telephone Encounter (Signed)
Noted  

## 2019-06-15 ENCOUNTER — Encounter: Payer: Self-pay | Admitting: Gastroenterology

## 2019-06-15 ENCOUNTER — Ambulatory Visit (INDEPENDENT_AMBULATORY_CARE_PROVIDER_SITE_OTHER): Payer: Medicare Other | Admitting: Gastroenterology

## 2019-06-15 ENCOUNTER — Other Ambulatory Visit: Payer: Self-pay

## 2019-06-15 VITALS — BP 149/78 | HR 61 | Temp 97.4°F | Ht 71.0 in | Wt 221.4 lb

## 2019-06-15 DIAGNOSIS — K861 Other chronic pancreatitis: Secondary | ICD-10-CM

## 2019-06-15 DIAGNOSIS — K5100 Ulcerative (chronic) pancolitis without complications: Secondary | ICD-10-CM

## 2019-06-15 NOTE — Patient Instructions (Signed)
I will contact you when Dr. Oneida Alar returns in one week to let you know her recommendations about pursuing a colonoscopy right now.

## 2019-06-15 NOTE — Progress Notes (Addendum)
REVIEWED. Diagnosed with PANCOLITIS JAN 2013. WOULD CONSIDER BENEFITS V. RISKS OF COMPLETE COLONOSCOPY  IN JAN 2023.    Primary Care Physician: Andres Shad, MD  Primary Gastroenterologist:  Barney Drain, MD   Chief Complaint  Patient presents with  . Follow-up    recall 3-5year    HPI: Louis Bowers. is a 84 y.o. male here for f/u and recall for 3-5 year colonoscopy. Last seen via virtual visit in 03/2019. He has h/o pan-ulcerative colitis, h/o pancreas divisum/chronic pancreatitis. no weight loss. Had been on chronic prednisone before establishing care with [facility]. Off now for over a year. Took Humira briefly but had to stop in 11/2018 because no longer qualified for patient assistance and could not afford medication. Now on Imuran. Initially started out on 45m daily but decreased to 578mdaily due to slight drop in platelets and increase in liver enzymes.   He was supposed to complete LFTs, CBC in June. He is overdue for 2 year surveillance MRI Abd/MRCP for pancreatic cystic lesion.  Clinically doing well. BM regular. No melena, brbpr. No UGI symptoms. No abdominal pain. Some arthritis pain.   Current Outpatient Medications  Medication Sig Dispense Refill  . acetaminophen (TYLENOL) 500 MG tablet Take 1,000 mg by mouth every 8 (eight) hours as needed for mild pain. Reported on 04/23/2016    . Ascorbic Acid (VITAMIN C PLUS WILD ROSE HIPS PO) Take 1,200 mg by mouth daily.    . Marland KitchenzaTHIOprine (IMURAN) 50 MG tablet 1.5 PO DAILY BECAUSE YOU REQUIRE ALLOPURINOL. (Patient taking differently: Take 50 mg by mouth daily. 1.5 PO DAILY BECAUSE YOU REQUIRE ALLOPURINOL.) 60 tablet 11  . Calcium Carbonate-Vit D-Min (CALCIUM 1200 PO) Take 1 capsule by mouth 2 (two) times daily. Calcium 120074m Vitamin D3 1600 IU    . carvedilol (COREG) 25 MG tablet Take 25 mg by mouth 2 (two) times daily.  6  . chlorthalidone (HYGROTON) 25 MG tablet Take 25 mg by mouth daily.    . Cholecalciferol (VITAMIN D3)  50000 units CAPS Take 1 capsule by mouth once a week.  0  . CREON 36000 units CPEP capsule TAKE 2 CAPSULES BY MOUTH WITH BREAKFAST, LUNCH AND SUPPER 200 capsule 11  . diphenhydrAMINE (BENADRYL) 25 mg capsule Take 25 mg by mouth at bedtime as needed for sleep.     . EMarland KitchenIQUIS 5 MG TABS tablet Take 5 mg by mouth 2 (two) times daily.     . febuxostat (ULORIC) 40 MG tablet Take 40 mg by mouth daily.    . finasteride (PROSCAR) 5 MG tablet Take 5 mg by mouth daily.     . gMarland Kitchenbapentin (NEURONTIN) 300 MG capsule Take 300 mg by mouth at bedtime.    . lMarland Kitchenvothyroxine (SYNTHROID, LEVOTHROID) 75 MCG tablet Take 1 tablet by mouth daily.  2  . metoprolol (LOPRESSOR) 50 MG tablet Take 50 mg by mouth every 8 (eight) hours.     . PMarland KitchenCERONE 100 MG tablet Take 100 mg by mouth daily.     . pantoprazole (PROTONIX) 40 MG tablet Take 40 mg by mouth daily.     . tamsulosin (FLOMAX) 0.4 MG CAPS capsule Take 0.4 mg by mouth daily after supper.     . vitamin C (ASCORBIC ACID) 500 MG tablet Take 500 mg by mouth daily.    . OMarland KitchenER THE COUNTER MEDICATION Osteo Bi Flex + Vitamin D twice a day     No current facility-administered medications for this visit.  Allergies as of 06/15/2019 - Review Complete 06/15/2019  Allergen Reaction Noted  . No known allergies  05/01/2017    ROS:  General: Negative for anorexia, weight loss, fever, chills, fatigue, weakness. ENT: Negative for hoarseness, difficulty swallowing , nasal congestion. CV: Negative for chest pain, angina, palpitations, dyspnea on exertion, peripheral edema.  Respiratory: Negative for dyspnea at rest, dyspnea on exertion, cough, sputum, wheezing.  GI: See history of present illness. GU:  Negative for dysuria, hematuria, urinary incontinence, urinary frequency, nocturnal urination.  Endo: Negative for unusual weight change.    Physical Examination:   BP (!) 149/78   Pulse 61   Temp (!) 97.4 F (36.3 C) (Oral)   Ht 5' 11"  (1.803 m)   Wt 221 lb 6.4 oz (100.4  kg)   BMI 30.88 kg/m   General: Well-nourished, well-developed in no acute distress.  Eyes: No icterus. Abdomen: Bowel sounds are normal, nontender, nondistended, no hepatosplenomegaly or masses, no abdominal bruits or hernia , no rebound or guarding.   Extremities: No lower extremity edema. No clubbing or deformities. Neuro: Alert and oriented x 4   Skin: Warm and dry, no jaundice.   Psych: Alert and cooperative, normal mood and affect.  Labs:  Lab Results  Component Value Date   CREATININE 1.33 (H) 11/26/2017   BUN 27 (H) 11/26/2017   NA 139 11/26/2017   K 4.1 11/26/2017   CL 104 11/26/2017   CO2 28 11/26/2017   Lab Results  Component Value Date   ALT 55 (H) 02/02/2019   AST 53 (H) 02/02/2019   ALKPHOS 130 (H) 02/02/2019   BILITOT 0.5 02/02/2019   Lab Results  Component Value Date   WBC 3.8 02/02/2019   HGB 11.5 (L) 02/02/2019   HCT 32.8 (L) 02/02/2019   MCV 96 02/02/2019   PLT 142 (L) 02/02/2019    Imaging Studies: No results found.

## 2019-06-21 ENCOUNTER — Encounter: Payer: Self-pay | Admitting: Gastroenterology

## 2019-06-21 NOTE — Assessment & Plan Note (Signed)
Clinically doing well. Diagnosed with pan-ulcerative colitis in 2013. Switched from Humira to Imuran earlier this year due to not being able to afford Humira and losing patient assistance. Currently on Imuran 26m daily (decreased from 793m due to decline in platelets and increase in hepatic enzymes. Due for CBC, LFTs at this time.   Consider colonoscopy at any time within the next two years per previous [facility] recommendations. To discuss with Dr. FiOneida Alaror timing.

## 2019-06-21 NOTE — Assessment & Plan Note (Signed)
Doing well. Continue Creon. H/O pancreatic cystic lesion due for MRI abd/MRCP for 2 yr surveillance study. Return to the office in four months to see Dr. Oneida Alar.

## 2019-06-24 ENCOUNTER — Telehealth: Payer: Self-pay | Admitting: Gastroenterology

## 2019-06-24 DIAGNOSIS — K862 Cyst of pancreas: Secondary | ICD-10-CM

## 2019-06-24 NOTE — Telephone Encounter (Signed)
Please let patient know that [facility] recommends would consider colonoscopy 11/2021 if benefits outweight risks. Please NIC.  He is also due for MRI abd with and wo contrast for pancreatic cystic lesion follow up.   He is due labs (previously ordered by [facility], [facility] and [facility]) also needs creatinine for mri.

## 2019-06-27 NOTE — Telephone Encounter (Signed)
I spoke to pt and he said he did his labs in June. ( See previous phone note, pt found his lab orders and was doing them in [place]). He will just get scheduled for the MRI and get creatinine done then. I am trying to locate the lab results from June.

## 2019-06-28 ENCOUNTER — Other Ambulatory Visit: Payer: Self-pay

## 2019-06-28 DIAGNOSIS — K862 Cyst of pancreas: Secondary | ICD-10-CM

## 2019-06-28 NOTE — Telephone Encounter (Signed)
Pt called back and wants to do creatinine today in [facility]. I have cancelled previous labs and made [facility] order and faxed to [facility] in [place].

## 2019-06-28 NOTE — Telephone Encounter (Signed)
Pt called office and was informed of appt and details. Appt letter mailed.

## 2019-06-28 NOTE — Addendum Note (Signed)
Addended by: Everardo All on: 06/28/2019 09:53 AM   Modules accepted: Orders

## 2019-06-28 NOTE — Telephone Encounter (Signed)
MRI scheduled for 07/04/19 at 11:00am, arrive at 10:30am. NPO 4 hours prior to test. Needs creatinine prior to test-order entered. Tried to call pt, spoke to wife and informed her. She will also have him call office back.

## 2019-06-28 NOTE — Progress Notes (Signed)
creatinine

## 2019-06-28 NOTE — Addendum Note (Signed)
Addended by: Hassan Rowan on: 06/28/2019 08:16 AM   Modules accepted: Orders

## 2019-06-29 ENCOUNTER — Other Ambulatory Visit: Payer: Self-pay

## 2019-06-29 ENCOUNTER — Telehealth: Payer: Self-pay | Admitting: Gastroenterology

## 2019-06-29 DIAGNOSIS — D539 Nutritional anemia, unspecified: Secondary | ICD-10-CM

## 2019-06-29 NOTE — Telephone Encounter (Signed)
I called MRI department and spoke with Santiago Glad. I have faxed the Creatinine result to her @ [phone].

## 2019-06-29 NOTE — Telephone Encounter (Signed)
I called [facility] customer service and the creatinine is now showing up yet. Pt said he had it drawn yesterday. I have faxed orders for the CBC, Folate, B12, ferritin to [facility] in [place], with a note to see if they can be added.

## 2019-06-29 NOTE — Telephone Encounter (Signed)
I received a call from the lab and the B12 and folate was added. They could not add the CBC and Folate.

## 2019-06-29 NOTE — Telephone Encounter (Signed)
Labs dated April 27, 2019 White blood cell count 3860, hemoglobin 11.3, hematocrit 34.1, MCV 112.25, platelets 150,000, albumin 3.6, total bilirubin 0.4, alkaline phosphatase 88, AST 18, ALT 20  Louis Bowers, looks like patient is going to [facility] to have creatinine drawn for MRI.   Can we add on CBC, folate, b12, ferritin for macrocytic anemia,

## 2019-06-29 NOTE — Telephone Encounter (Signed)
Louis Bowers, can our office scan in the Creatinine from the [facility] in [place] so MRI will have access to it?   His MRI is 8/24 and if we send off to have scanned, it will not be available.   Creatinine 1.48H on 06/28/2019

## 2019-07-04 ENCOUNTER — Other Ambulatory Visit: Payer: Self-pay

## 2019-07-04 ENCOUNTER — Other Ambulatory Visit: Payer: Self-pay | Admitting: Gastroenterology

## 2019-07-04 ENCOUNTER — Ambulatory Visit (HOSPITAL_COMMUNITY)
Admission: RE | Admit: 2019-07-04 | Discharge: 2019-07-04 | Disposition: A | Discharge status: Home / Self Care | Payer: Medicare Other | Source: Ambulatory Visit | Attending: Gastroenterology | Admitting: Gastroenterology

## 2019-07-04 DIAGNOSIS — K862 Cyst of pancreas: Secondary | ICD-10-CM

## 2019-07-04 LAB — POCT I-STAT CREATININE: Creatinine, Ser: 1.4 mg/dL — ABNORMAL HIGH (ref 0.61–1.24)

## 2019-07-04 MED ORDER — GADOBUTROL 1 MMOL/ML IV SOLN
10.0000 mL | Freq: Once | INTRAVENOUS | Status: AC | PRN
  Administered 2019-07-04: 11:00:00 10 mL via INTRAVENOUS

## 2019-07-06 NOTE — Telephone Encounter (Signed)
Louis Bowers and Louis Bowers, I have not received labs from the [facility] lab patient went to recently.

## 2019-07-07 NOTE — Telephone Encounter (Signed)
I have faxed a request for these.

## 2019-07-08 NOTE — Telephone Encounter (Signed)
Labs received via fax and placed in [place] box.

## 2019-07-11 NOTE — Telephone Encounter (Signed)
Patient needs OV with [facility] in four months.

## 2019-07-12 NOTE — Progress Notes (Signed)
ON RECALL  °

## 2019-07-12 NOTE — Telephone Encounter (Signed)
ON RECALL FOR [facility] FU OV

## 2019-07-22 ENCOUNTER — Telehealth: Payer: Self-pay | Admitting: Gastroenterology

## 2019-07-22 NOTE — Telephone Encounter (Signed)
Labs dated 06/28/2019: Creatinine 1.48, ferritin 10.1 low, B12 232 normal  Louis Bowers, [facility] in [place] was supposed to do a CBC as well. Can we see if that was done.

## 2019-07-26 NOTE — Telephone Encounter (Signed)
Sorry for miscommunication. I had wanted [place] to notify lab to have those extra labs done because he had not been yet but looks like we had labs added on after blood drawn.   Given low ferritin, anemia history of meds please have him go for further labs at his convenience.   CBC, folate.

## 2019-07-26 NOTE — Telephone Encounter (Signed)
Louis Bowers, please see telephone encounter of 07/07/2019. They could not add the CBC.

## 2019-07-29 ENCOUNTER — Other Ambulatory Visit: Payer: Self-pay

## 2019-07-29 DIAGNOSIS — D539 Nutritional anemia, unspecified: Secondary | ICD-10-CM

## 2019-07-29 DIAGNOSIS — K51819 Other ulcerative colitis with unspecified complications: Secondary | ICD-10-CM

## 2019-07-29 DIAGNOSIS — E538 Deficiency of other specified B group vitamins: Secondary | ICD-10-CM

## 2019-07-29 DIAGNOSIS — D649 Anemia, unspecified: Secondary | ICD-10-CM

## 2019-07-29 NOTE — Telephone Encounter (Signed)
PT is aware and Ok to mail the orders to him.

## 2019-08-03 NOTE — Telephone Encounter (Signed)
Lab orders mailed to pt.

## 2019-09-12 ENCOUNTER — Telehealth: Payer: Self-pay | Admitting: Gastroenterology

## 2019-09-12 NOTE — Telephone Encounter (Signed)
Labs stable with H/H 11.6/33.6. MCV 107.3. folate is normal at 15.9, platelets 128,000.    Mild anemia/thrombocytopenia may be secondary to imuran. Although his ferritin is also low indicating iron def.  Let's have pt return for ov with [facility] to discuss imuran/anemia/thrombocytopenia/IDA. May require colonoscopy.

## 2019-09-13 ENCOUNTER — Encounter: Payer: Self-pay | Admitting: Gastroenterology

## 2019-09-13 NOTE — Telephone Encounter (Signed)
PATIENT SCHEDULED AND LETTER SENT  °

## 2019-09-13 NOTE — Telephone Encounter (Signed)
PT is aware and forwarding to [facility] to schedule the OV appointment with Dr. Oneida Alar.

## 2019-10-19 ENCOUNTER — Ambulatory Visit (INDEPENDENT_AMBULATORY_CARE_PROVIDER_SITE_OTHER): Payer: Medicare Other | Admitting: Gastroenterology

## 2019-10-19 ENCOUNTER — Other Ambulatory Visit: Payer: Self-pay | Admitting: Nurse Practitioner

## 2019-10-19 ENCOUNTER — Encounter: Payer: Self-pay | Admitting: Gastroenterology

## 2019-10-19 ENCOUNTER — Other Ambulatory Visit: Payer: Self-pay

## 2019-10-19 DIAGNOSIS — K5100 Ulcerative (chronic) pancolitis without complications: Secondary | ICD-10-CM | POA: Diagnosis not present

## 2019-10-19 DIAGNOSIS — K861 Other chronic pancreatitis: Secondary | ICD-10-CM | POA: Diagnosis not present

## 2019-10-19 NOTE — Assessment & Plan Note (Signed)
LAST MRCP AUG 2020.  NEEDS REPEAT MRCP IN AUG 2022. CONTINUE CREON. PLEASE CALL WITH QUESTIONS OR CONCERNS. FOLLOW UP IN 6 MOS.

## 2019-10-19 NOTE — Patient Instructions (Signed)
CONTINUE CREON.  PLEASE CALL WITH QUESTIONS OR CONCERNS.  YOU NEED A LIVER PANEL AND COMPLETE BLOOD COUNT IN [facility] 2021.  FOLLOW UP IN 6 MOS.

## 2019-10-19 NOTE — Assessment & Plan Note (Signed)
SYMPTOMS CONTROLLED/RESOLVED WITHOUT MEDS.  CONTINUE TO MONITOR SYMPTOMS. LIVER PANEL AND COMPLETE BLOOD COUNT IN [facility] 2021. FOLLOW UP IN 6 MOS.

## 2019-10-19 NOTE — Progress Notes (Signed)
Subjective:    Patient ID: Louis Bowers., male    DOB: [birth year], 84 y.o.   MRN: [id number] Andres Shad, MD   HPI AS LONGA S HE LEAVES SNACK ALONE AND DOESN'T OVER EAT HIS STOMACH FEELS FINE. BMs: 1-2X/ DAY. NO DIARRHEA OR CONSTIPATION. EATS A LOT OF FRUIT. Attends church once a week with social distancing and masks.  PT DENIES FEVER, CHILLS, HEMATOCHEZIA, HEMATEMESIS, nausea, vomiting, melena, CHEST PAIN, SHORTNESS OF BREATH,  CHANGE IN BOWEL IN HABITS, abdominal pain, problems swallowing, OR heartburn or indigestion.  Past Medical History:  Diagnosis Date  . A-fib ([place])    Dr. Rosalita Chessman  . Bladder cancer ([place]) 2004   instilled chemo  . Chronic renal insufficiency, stage III (moderate)    [facility]  . Hypertension   . Hypothyroidism   . Osteoarthritis   . Pancreas divisum, native   . Pancreatitis    Dr.  Oliva Bustard. Shifflett  . UC (ulcerative colitis) ([place])    Dr. Spainhour/Dr. Shifflett: diagnosed 11/2011 involved entire colon   Past Surgical History:  Procedure Laterality Date  . BACK SURGERY  05/2003  . BACK SURGERY  06/2014  . BACK SURGERY  08/2015  . BLADDER SURGERY  02/2009  . CARDIAC CATHETERIZATION    . CARDIOVASCULAR STRESS TEST    . COLONOSCOPY N/A 2005 [employer]   Dr. Nathaneil Canary Shiflett: 5 polyps removed from cecal area all were benign  . COLONOSCOPY  05/2011   Dr. Nathaneil Canary Shiflett:no polyps, diverticulosis  . ESOPHAGOGASTRODUODENOSCOPY  2005 [place]   Dr. Nathaneil Canary Shiflett: normal except for antral gastritis  . EUS  09/2011   [facility]: chronic pancreatitis vs chronic smodering pancreatitis  . FLEXIBLE SIGMOIDOSCOPY  02/2012   Dr. Nathaneil Canary Shiflett: advance up to 60cm, entire left colon with intense UC flare, no bx as patient was on coumdin  . FLEXIBLE SIGMOIDOSCOPY  11/2011   Dr. Nathaneil Canary Shiflett: diffuse colitis universal, cecal polyp removed (adenoma)  . KNEE SURGERY Left 05/04/2017   partial  . PARTIAL KNEE ARTHROPLASTY Left  05/04/2017   Procedure: UNICOMPARTMENTAL KNEE;  Surgeon: Vickey Huger, MD;  Location: [facility];  Service: Orthopedics;  Laterality: Left;  . REPLACEMENT TOTAL KNEE Right   . US ECHOCARDIOGRAPHY     Allergies  Allergen Reactions  . No Known Allergies    Current Outpatient Medications  Medication Sig    . acetaminophen (TYLENOL) 500 MG tablet Take 1,000 mg by mouth every 8 (eight) hours as needed for mild pain. Reported on 04/23/2016    . Ascorbic Acid (VITAMIN C PLUS WILD ROSE HIPS PO) Take 1,200 mg by mouth daily.    . Calcium Carbonate-Vit D-Min (CALCIUM 1200 PO) Take 1 capsule by mouth 2 (two) times daily. Calcium 124m + Vitamin D3 1600 IU    . carvedilol (COREG) 25 MG tablet Take 25 mg by mouth 2 (two) times daily.    . chlorthalidone (HYGROTON) 25 MG tablet Take 25 mg by mouth daily.    . Cholecalciferol (VITAMIN D3) 50000 units CAPS Take 1 capsule by mouth once a week.    .Marland KitchenCREON 36000 units CPEP capsule TAKE TWO CAPSULES BY MOUTH WITH BREAKFAST, LUNCH, AND SUPPER AS DIRECTED    . diphenhydrAMINE (BENADRYL) 25 mg capsule Take 25 mg by mouth at bedtime as needed for sleep.     .Marland KitchenELIQUIS 5 MG TABS tablet Take 5 mg by mouth 2 (two) times daily.     . febuxostat (ULORIC) 40 MG tablet Take  40 mg by mouth daily.    . finasteride (PROSCAR) 5 MG tablet Take 5 mg by mouth daily.     Marland Kitchen gabapentin (NEURONTIN) 300 MG capsule Take 300 mg by mouth at bedtime.    Marland Kitchen levothyroxine (SYNTHROID, LEVOTHROID) 75 MCG tablet Take 1 tablet by mouth daily.    Marland Kitchen OVER THE COUNTER MEDICATION Osteo Bi Flex + Vitamin D twice a day    . PACERONE 100 MG tablet Take 100 mg by mouth daily.     . pantoprazole (PROTONIX) 40 MG tablet Take 40 mg by mouth daily.     . tamsulosin (FLOMAX) 0.4 MG CAPS capsule Take 0.4 mg by mouth daily after supper.     . vitamin C (ASCORBIC ACID) 500 MG tablet Take 500 mg by mouth daily.    .      . metoprolol (LOPRESSOR) 50 MG tablet Take 50 mg by mouth every 8 (eight) hours.      Review  of Systems PER HPI OTHERWISE ALL SYSTEMS ARE NEGATIVE.    Objective:   Physical Exam Constitutional:      General: He is not in acute distress.    Appearance: Normal appearance.  HENT:     Mouth/Throat:     Comments: MASK IN PLACE Eyes:     General: No scleral icterus.    Pupils: Pupils are equal, round, and reactive to light.  Neck:     Musculoskeletal: Normal range of motion.  Cardiovascular:     Rate and Rhythm: Normal rate and regular rhythm.     Pulses: Normal pulses.     Heart sounds: Normal heart sounds.  Pulmonary:     Effort: Pulmonary effort is normal.     Breath sounds: Normal breath sounds.  Abdominal:     General: Bowel sounds are normal.     Palpations: Abdomen is soft.     Tenderness: There is no abdominal tenderness.  Musculoskeletal:     Right lower leg: No edema.     Left lower leg: No edema.  Lymphadenopathy:     Cervical: No cervical adenopathy.  Skin:    General: Skin is warm and dry.  Neurological:     Mental Status: He is alert and oriented to person, place, and time.     Comments: NO  NEW FOCAL DEFICITS  Psychiatric:        Mood and Affect: Mood normal.     Comments: NORMAL AFFECT       Assessment & Plan:

## 2019-11-08 ENCOUNTER — Other Ambulatory Visit: Payer: Self-pay

## 2019-11-08 DIAGNOSIS — K5190 Ulcerative colitis, unspecified, without complications: Secondary | ICD-10-CM

## 2019-11-08 DIAGNOSIS — D649 Anemia, unspecified: Secondary | ICD-10-CM

## 2019-11-24 ENCOUNTER — Telehealth: Payer: Self-pay

## 2019-11-24 NOTE — Telephone Encounter (Signed)
Application for [facility] ABBIE Assist paperwork on chair for Dr. Oneida Alar to complete and sign.

## 2019-11-25 NOTE — Telephone Encounter (Signed)
PLEASE CALL PT. His HUMIRA PAPERWORK IS COMPLETE. CONTINUE IMURAN. THE MEDS WILL BE SENT TO HIS HOME. PLEASE CALL WHEN MEDS ARRIVE AND WE WILL REVIEW HOW HE'S TO ADMINISTER THE HUMIRA. HE ADMINISTER MEDS AS IF HE NEVER HAS TAKEN HUMIRA.  DISCUSSED WITH PHARMACY AND DR. [place], [facility]. OK TO RE-START HUMIRA. PT COULD DEVELOP ANTIBODIES. CONSIDER CHECK ING Ab IF DISEASE NOT CONTROLLED ON HUMIRA.

## 2019-11-28 ENCOUNTER — Telehealth: Payer: Self-pay

## 2019-11-28 ENCOUNTER — Other Ambulatory Visit: Payer: Self-pay

## 2019-11-28 DIAGNOSIS — D649 Anemia, unspecified: Secondary | ICD-10-CM

## 2019-11-28 DIAGNOSIS — R1030 Lower abdominal pain, unspecified: Secondary | ICD-10-CM

## 2019-11-28 NOTE — Telephone Encounter (Signed)
I called pt and updated his med list and added the Imuran which was not on his last note.

## 2019-11-28 NOTE — Telephone Encounter (Addendum)
PT called and was complaining of lower abdominal pain for the last week intermittently.  He said the pain is in the bottom of his stomach right in the middle.It hurts at night time especially if he eats too much.  He had one BM that stool was a little hard, but for the most part his stools are normal as long as he eats fruits and fiber as he should. He is aware Dr. Oneida Alar said for him to continue Imuran and he has been taking that daily at one and one half tablet daily ( 75 mg). At one point he was decreased to 50 mg and he is not sure if he is to be on 50 0r 75 mg daily. He is taking the entocort daily.   Forwarding to Neil Crouch, PA , in Dr. Oneida Alar absence to advise!

## 2019-11-28 NOTE — Telephone Encounter (Signed)
It is not clear in his chart regarding Imuran. When I saw him in 06/2019 he was on Imuran 30m daily. Per 10/2019 OV note with [facility], Imuran is not on his list??? However telephone note 11/24/19 says continue Imuran.  And I don't see anything about Entocort in the last several months. Did he start on that at direction of another provider or just have some at home???  I would advise Imuran 54mdaily for now.  Reduce diet to low residue for few days (low fiber and easy to digest).  Avoid overeating.  Continue Creon.  Await Humira. Have him go ahead and get labs since having pain: CBC, CMET, lipase

## 2019-11-28 NOTE — Telephone Encounter (Signed)
Pt has not been on Entocort, he keeps getting it mixed up with Creon for some reason. He is taking the Creon 2 tabs tid. He was taking Imuran 75 but made him a note to take 50 mg daily.  He is aware of the diet instructions and that I have faxed his form for Humira. Lab orders he requested to be faxed to [facility] and I faxed to [phone].

## 2019-11-28 NOTE — Telephone Encounter (Signed)
Pt is aware of the info. He has faxed his info to [employer]. I have faxed our paperwork.

## 2019-11-28 NOTE — Telephone Encounter (Signed)
Noted. Thanks.

## 2019-12-02 NOTE — Telephone Encounter (Addendum)
Called patient TO DISCUSS CONCERNS. HAVING LOWER ABDOMINAL PAIN. THINKS IT'S WHAT HE'S EATING. WAITING ON HUMIRA. IMURAN 50 MG DAILY. INADVERTENTLY INCREASED TO 75 MG DAILY BUT NOW BACK DOWN ON IMURAN TO 50 MG DAILY.  NO ENTOCORT(BUDESONIDE). ABDOMINAL PAIN IS BETTER.   PLAN: 1. IMURAN 50 MG DAILY. WILL D/C IMURAN WHEN HE TAKES HIS FIRST 40 MG SQ INJECTION. 2. WAIT ON HUMIRA. WILL START 160 MG x1, 2 WEEKS 80 MG x1 THEN IN 2 WEEKS 40 MG Q2WEEKS. 3. PT WILL CALL WHEN HUMIRA ARRIVES AND WE WILL REVIEW THE PREVIOUS INSTRUCTIONS.

## 2019-12-05 NOTE — Telephone Encounter (Signed)
NOTED

## 2019-12-06 ENCOUNTER — Telehealth: Payer: Self-pay | Admitting: Gastroenterology

## 2019-12-06 ENCOUNTER — Other Ambulatory Visit: Payer: Self-pay

## 2019-12-06 DIAGNOSIS — D539 Nutritional anemia, unspecified: Secondary | ICD-10-CM

## 2019-12-06 DIAGNOSIS — K5190 Ulcerative colitis, unspecified, without complications: Secondary | ICD-10-CM

## 2019-12-06 NOTE — Telephone Encounter (Signed)
Received labs from November 30, 2019 White blood cell count 3520 slightly low, hemoglobin 11.6 low, hematocrit 33.7, MCV 117.5, platelets 144,000, BUN 25, creatinine 1.49, albumin 3.7, total bilirubin 0.6, AST 21, ALT 20, alkaline phosphatase 81, lipase 119.  Overall labs are stable.  MCV slightly higher.  Previously folate level was normal.  Advise Imuran 50 mg daily, patient recently decreased from 75 mg daily.  Recheck CBC, B12 level in 6 weeks.

## 2019-12-06 NOTE — Telephone Encounter (Signed)
PT called and was informed of results and plan. I will mail his lab orders to him to do in 6 weeks.

## 2019-12-06 NOTE — Telephone Encounter (Signed)
I received a fax from [facility] that they had not received the patient's paperwork. I called and informed pt and he said he faxed it before we got our paperwork, but he will refax it. I faxed the note back to them that he will refax his part.

## 2019-12-06 NOTE — Telephone Encounter (Signed)
Lab orders mailed with a note to do on or around 01/17/2020.

## 2019-12-06 NOTE — Telephone Encounter (Signed)
Left message with pt's wife for a return call.

## 2019-12-06 NOTE — Telephone Encounter (Signed)
See previous note. I have spoken to pt.

## 2019-12-06 NOTE — Telephone Encounter (Signed)
Pt was returning a call. [phone]

## 2019-12-19 ENCOUNTER — Telehealth: Payer: Self-pay

## 2019-12-19 NOTE — Telephone Encounter (Signed)
Received lab results from [facility] that were done on 12/15/2019.  Placed in box for review by Neil Crouch, PA.

## 2019-12-20 ENCOUNTER — Telehealth: Payer: Self-pay

## 2019-12-20 ENCOUNTER — Other Ambulatory Visit: Payer: Self-pay

## 2019-12-20 DIAGNOSIS — R1030 Lower abdominal pain, unspecified: Secondary | ICD-10-CM

## 2019-12-20 NOTE — Telephone Encounter (Signed)
REVIEWED. Will await lab results. CONTINUE IMURAN.

## 2019-12-20 NOTE — Telephone Encounter (Signed)
PLEASE CALL PT. HOW ARE HIS BOWEL MOVEMENTS? He should have a CMP/LIPASE(STAT, Dx: ABDOMINAL PAIN) drawn TODAY IF POSSIBLE. IF IT IS NORMAL THEN  WE WILL CONSIDER ORDERING A CT SCAN OF THE ABD/PELVIS.

## 2019-12-20 NOTE — Telephone Encounter (Signed)
Pt is aware to go to the lab for STAT blood work.  [facility] [place] where he gets his done.  He is having a BM everyday, just when he doesn't eat fruit his stool will be a little more firm.  I also informed pt that I just received a fax from [facility] that he does not meet the eligibility criteria for the program.  If we can provide additional insurance or financial information they will review further. PT is aware of this.

## 2019-12-20 NOTE — Telephone Encounter (Signed)
Pt called to say he is having some abdominal pain in the lower abdomen in the middle everyday and it is mostly constant. He is taking Imuran 50 mg daily now and has been for a while.  He says he does eat some spicy foods ( uses Sweet Baby [employer] sauce on food) but when he does he uses it at lunch time and he eats more bland food in the evening time.   He said he ate like this when he was taking Imuran 75 mg daily and he did not have any problems.   He has been in touch with [employer] about Humira and they had needed him to send another 1040 form and he has done so.  He is aware his lab results were sent to Neil Crouch, PA, that she had ordered and we just received those yesterday and they are in her box for review.   His last office visit was with Dr. Oneida Alar on 10/19/2019 and I will forward this note to her to advise.

## 2019-12-21 NOTE — Telephone Encounter (Signed)
Received the lab results on CMP and Lipase.  The abnormal values were:   BUN  27  ( 7-18)  Creatinine  1.34  ( 0.70-1.30)   Glucose  113 ( 74-106)  Results placed on Dr. Oneida Alar chair for review!

## 2019-12-21 NOTE — Telephone Encounter (Signed)
Labs from 12/15/19:  WBC 4200, H/H 11.6/34.2, MCV 115.2, platelet 172000, B12 254.  H/H stable, MCV slightly less. B12 normal.  Doris, see telephone note from yesterday, I did not receive the labs [facility] ordered. Please make sure we get those results for [facility].

## 2019-12-21 NOTE — Telephone Encounter (Signed)
Pt is aware of results.  He did go yesterday about 1:30 and had the labs drawn.  I will call and see if we can get the results.

## 2019-12-21 NOTE — Telephone Encounter (Signed)
I called and they are faxing the results to me.  See note from yesterday.

## 2019-12-21 NOTE — Telephone Encounter (Signed)
I called [facility] and they are faxing the lab results to me.

## 2019-12-22 NOTE — Telephone Encounter (Signed)
PLEASE CALL PT. HIS LIVER PANEL AND PANCREAS ENZYMES ARE NORMAL. HIS KIDNEY FUNCTION IS SLIGHT REDUCED. HIS CREATININE IN 1.34. HE SHOULD DISCUSS WITH HIS PCP.

## 2019-12-22 NOTE — Telephone Encounter (Signed)
I received a fax that pt had been denied assistance from [place].  I called [employer] and spoke to [facility] who said pt called and informed them of some out of pocket expenses and they are reviewing again.

## 2019-12-22 NOTE — Telephone Encounter (Signed)
Pt is aware of results. 

## 2019-12-23 ENCOUNTER — Telehealth: Payer: Self-pay

## 2019-12-23 NOTE — Telephone Encounter (Signed)
Pt called just to get his creatinine level again to discuss with his nephrologist.  He wrote it down to this time.

## 2019-12-27 ENCOUNTER — Telehealth: Payer: Self-pay

## 2019-12-27 NOTE — Telephone Encounter (Signed)
My [employer] faxed request for some more info in reference to the Humira. Placing on Dr. Oneida Alar desk.

## 2019-12-28 ENCOUNTER — Other Ambulatory Visit: Payer: Self-pay | Admitting: Gastroenterology

## 2020-01-02 NOTE — Telephone Encounter (Signed)
Pt left Vm that he will need the prescription for the Humira. Forwarding to Dr. Oneida Alar.

## 2020-01-05 NOTE — Telephone Encounter (Signed)
Paperwork faxed to [employer] and pt is aware.

## 2020-01-05 NOTE — Telephone Encounter (Signed)
HUMIRA PAPERWORK IS COMPLETE.

## 2020-01-12 ENCOUNTER — Other Ambulatory Visit: Payer: Self-pay | Admitting: *Deleted

## 2020-01-12 DIAGNOSIS — K5190 Ulcerative colitis, unspecified, without complications: Secondary | ICD-10-CM

## 2020-01-12 DIAGNOSIS — D649 Anemia, unspecified: Secondary | ICD-10-CM

## 2020-01-13 ENCOUNTER — Encounter: Payer: Self-pay | Admitting: *Deleted

## 2020-01-25 ENCOUNTER — Telehealth: Payer: Self-pay | Admitting: *Deleted

## 2020-01-25 NOTE — Telephone Encounter (Addendum)
Pt called to let us know that he did start his Humira shot.  Pt also informed us that he stopped Imuran.  All questions were addressed and answered.  Pt voiced understanding.

## 2020-01-25 NOTE — Telephone Encounter (Signed)
Patient called in requesting to speak with someone regarding his humira shot.

## 2020-02-28 ENCOUNTER — Telehealth: Payer: Self-pay | Admitting: Gastroenterology
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
## 2020-02-28 NOTE — Telephone Encounter (Signed)
Called pt and notified his wife that doctor fields is unable to give orders at [facility]. Unfortunately she only practices with [facility] and to please f/u his care with the providers at [facility]

## 2020-03-27 ENCOUNTER — Encounter: Payer: Self-pay | Admitting: Gastroenterology

## 2020-04-03 ENCOUNTER — Telehealth: Payer: Self-pay | Admitting: Gastroenterology

## 2020-04-03 ENCOUNTER — Telehealth: Payer: Self-pay | Admitting: Emergency Medicine

## 2020-04-03 NOTE — Telephone Encounter (Signed)
noted 
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
## 2020-04-03 NOTE — Telephone Encounter (Signed)
FYI

## 2021-03-10 DEATH — deceased

## 2021-06-06 ENCOUNTER — Telehealth: Payer: Self-pay | Admitting: Internal Medicine

## 2021-06-06 NOTE — Telephone Encounter (Signed)
Recall sent 

## 2021-06-06 NOTE — Telephone Encounter (Signed)
RECALL FOR MRI

## 2021-06-12 ENCOUNTER — Telehealth: Payer: Self-pay | Admitting: Gastroenterology

## 2021-06-12 NOTE — Telephone Encounter (Signed)
Patient wife called to let us know that he passed away March 09, 2021

## 2021-06-12 NOTE — Telephone Encounter (Signed)
Sorry to hear this.
